# Patient Record
Sex: Female | Born: 1990
Health system: Southern US, Community
[De-identification: ages and names within clinical notes are randomized; demographics above are authoritative.]

## PROBLEM LIST (undated history)

## (undated) DIAGNOSIS — D649 Anemia, unspecified: Secondary | ICD-10-CM

---

## 2015-09-12 DIAGNOSIS — G43009 Migraine without aura, not intractable, without status migrainosus: Secondary | ICD-10-CM | POA: Insufficient documentation

## 2016-01-17 DIAGNOSIS — J301 Allergic rhinitis due to pollen: Secondary | ICD-10-CM | POA: Insufficient documentation

## 2016-09-16 DIAGNOSIS — F419 Anxiety disorder, unspecified: Secondary | ICD-10-CM | POA: Insufficient documentation

## 2016-09-16 DIAGNOSIS — F32A Depression, unspecified: Secondary | ICD-10-CM | POA: Insufficient documentation

## 2017-03-06 DIAGNOSIS — G4700 Insomnia, unspecified: Secondary | ICD-10-CM | POA: Insufficient documentation

## 2017-10-01 MED FILL — AZITHROMYCIN 250 MG TABS: 250 | 5 days supply | Qty: 6 | Fill #0

## 2017-10-01 MED FILL — predniSONE 20 MG TABS: 20 | 5 days supply | Qty: 10 | Fill #0

## 2018-05-26 MED FILL — EPINEPHRINE 0.3 MG AUTO-INJ: 0.3 | 30 days supply | Qty: 2 | Fill #0

## 2018-11-18 ENCOUNTER — Other Ambulatory Visit: Payer: Self-pay

## 2018-11-18 ENCOUNTER — Telehealth: Payer: Self-pay | Admitting: Internal Medicine

## 2018-11-18 DIAGNOSIS — Z20822 Contact with and (suspected) exposure to covid-19: Secondary | ICD-10-CM

## 2018-11-18 NOTE — Telephone Encounter (Addendum)
Employee called stating that she had spoken with Victorino Dike on 11/17/18 and was informed that if she could go for COVID-19 testing on 11/18/18 to call back later to inform staff.   Gave patient instructions as to the location of testing site its's at  [facility] [place]hang and hours are from 9am to 11am.   Order will be placed for testing

## 2019-03-03 MED FILL — LARIN 1.5 MG-30 MCG TABLET: 1.5-30 | 28 days supply | Qty: 28 | Fill #0

## 2019-06-14 DIAGNOSIS — J301 Allergic rhinitis due to pollen: Secondary | ICD-10-CM | POA: Diagnosis not present

## 2019-06-14 DIAGNOSIS — Z3041 Encounter for surveillance of contraceptive pills: Secondary | ICD-10-CM | POA: Diagnosis not present

## 2019-06-14 DIAGNOSIS — Z0000 Encounter for general adult medical examination without abnormal findings: Secondary | ICD-10-CM | POA: Diagnosis not present

## 2019-06-14 DIAGNOSIS — G4700 Insomnia, unspecified: Secondary | ICD-10-CM | POA: Diagnosis not present

## 2019-06-14 DIAGNOSIS — F3342 Major depressive disorder, recurrent, in full remission: Secondary | ICD-10-CM | POA: Diagnosis not present

## 2019-06-14 DIAGNOSIS — J3081 Allergic rhinitis due to animal (cat) (dog) hair and dander: Secondary | ICD-10-CM | POA: Diagnosis not present

## 2019-06-14 DIAGNOSIS — Z23 Encounter for immunization: Secondary | ICD-10-CM | POA: Diagnosis not present

## 2019-06-23 DIAGNOSIS — Z131 Encounter for screening for diabetes mellitus: Secondary | ICD-10-CM | POA: Diagnosis not present

## 2019-06-23 DIAGNOSIS — L678 Other hair color and hair shaft abnormalities: Secondary | ICD-10-CM | POA: Diagnosis not present

## 2019-06-23 DIAGNOSIS — Z1159 Encounter for screening for other viral diseases: Secondary | ICD-10-CM | POA: Diagnosis not present

## 2019-06-23 DIAGNOSIS — L709 Acne, unspecified: Secondary | ICD-10-CM | POA: Diagnosis not present

## 2019-06-23 DIAGNOSIS — Z13220 Encounter for screening for lipoid disorders: Secondary | ICD-10-CM | POA: Diagnosis not present

## 2019-09-29 DIAGNOSIS — M542 Cervicalgia: Secondary | ICD-10-CM | POA: Diagnosis not present

## 2019-12-13 DIAGNOSIS — Z124 Encounter for screening for malignant neoplasm of cervix: Secondary | ICD-10-CM | POA: Diagnosis not present

## 2019-12-14 LAB — HM PAP SMEAR: HM Pap smear: NEGATIVE

## 2019-12-27 DIAGNOSIS — Z20822 Contact with and (suspected) exposure to covid-19: Secondary | ICD-10-CM | POA: Diagnosis not present

## 2020-02-29 HISTORY — PX: WISDOM TOOTH EXTRACTION: SHX21

## 2020-06-26 DIAGNOSIS — J301 Allergic rhinitis due to pollen: Secondary | ICD-10-CM | POA: Diagnosis not present

## 2020-06-26 DIAGNOSIS — J3089 Other allergic rhinitis: Secondary | ICD-10-CM | POA: Diagnosis not present

## 2020-06-26 DIAGNOSIS — J3081 Allergic rhinitis due to animal (cat) (dog) hair and dander: Secondary | ICD-10-CM | POA: Diagnosis not present

## 2020-06-26 DIAGNOSIS — H1013 Acute atopic conjunctivitis, bilateral: Secondary | ICD-10-CM | POA: Diagnosis not present

## 2020-07-05 DIAGNOSIS — Z13220 Encounter for screening for lipoid disorders: Secondary | ICD-10-CM | POA: Diagnosis not present

## 2020-07-05 DIAGNOSIS — Z113 Encounter for screening for infections with a predominantly sexual mode of transmission: Secondary | ICD-10-CM | POA: Diagnosis not present

## 2020-07-05 DIAGNOSIS — Z131 Encounter for screening for diabetes mellitus: Secondary | ICD-10-CM | POA: Diagnosis not present

## 2020-07-06 DIAGNOSIS — Z3201 Encounter for pregnancy test, result positive: Secondary | ICD-10-CM | POA: Diagnosis not present

## 2020-08-01 ENCOUNTER — Ambulatory Visit (INDEPENDENT_AMBULATORY_CARE_PROVIDER_SITE_OTHER): Payer: BC Managed Care – PPO

## 2020-08-01 ENCOUNTER — Other Ambulatory Visit: Payer: Self-pay

## 2020-08-01 VITALS — Ht 63.0 in

## 2020-08-01 DIAGNOSIS — Z789 Other specified health status: Secondary | ICD-10-CM

## 2020-08-01 DIAGNOSIS — Z3400 Encounter for supervision of normal first pregnancy, unspecified trimester: Secondary | ICD-10-CM | POA: Insufficient documentation

## 2020-08-01 DIAGNOSIS — O3680X0 Pregnancy with inconclusive fetal viability, not applicable or unspecified: Secondary | ICD-10-CM

## 2020-08-01 DIAGNOSIS — Z3401 Encounter for supervision of normal first pregnancy, first trimester: Secondary | ICD-10-CM

## 2020-08-01 NOTE — Progress Notes (Signed)
Patient ID: Nicole Clay, female   DOB: 05-28-1991, 30 y.o.   MRN: [id number] Patient was assessed and managed by nursing staff during this encounter. I have reviewed the chart and agree with the documentation and plan. I have also made any necessary editorial changes.  Scheryl Darter, MD 08/01/2020 11:34 AM

## 2020-08-01 NOTE — Progress Notes (Signed)
Patient ID: Nicole Clay, female   DOB: 11/11/1990, 29 y.o.   MRN: [id number] Patient was assessed and managed by nursing staff during this encounter. I have reviewed the chart and agree with the documentation and plan. I have also made any necessary editorial changes.  Zoraya Fiorenza, MD 08/01/2020 11:34 AM    

## 2020-08-01 NOTE — Progress Notes (Signed)
PRENATAL INTAKE SUMMARY  Nicole Clay presents today New OB Nurse Interview.  OB History    Gravida  1   Para      Term      Preterm      AB      Living        SAB      IAB      Ectopic      Multiple      Live Births             I have reviewed the patient's medical, obstetrical, social, and family histories, medications, and available lab results.  SUBJECTIVE She has no unusual complaints  OBJECTIVE Initial Physical Exam (New OB)  GENERAL APPEARANCE: alert, well appearing   ASSESSMENT Normal pregnancy  PLAN Prenatal care to be completed at [facility] All New OB labs to be completed at [facility] provider visit Baby Scripts Ordered Blood Pressure cuff given U/S performed today reveals single live IUP at [redacted]w[redacted]d by [facility]. FHR 161 PHQ2 score:0 GAD 7 score: 0

## 2020-08-03 ENCOUNTER — Telehealth: Payer: Self-pay | Admitting: *Deleted

## 2020-08-03 NOTE — Telephone Encounter (Signed)
Pt had pfizer vaccine 1st dose on 07-21-2019 at [facility], 2nd dose on 08-08-2019 and pt is sch for pfizer booster on 08-09-2020 at [facility]

## 2020-08-09 ENCOUNTER — Ambulatory Visit: Payer: BC Managed Care – PPO

## 2020-08-17 ENCOUNTER — Other Ambulatory Visit (HOSPITAL_COMMUNITY)
Admission: RE | Admit: 2020-08-17 | Discharge: 2020-08-17 | Disposition: A | Discharge status: Home / Self Care | Payer: BC Managed Care – PPO | Source: Ambulatory Visit | Attending: Obstetrics and Gynecology | Admitting: Obstetrics and Gynecology

## 2020-08-17 ENCOUNTER — Encounter: Payer: Self-pay | Admitting: Obstetrics and Gynecology

## 2020-08-17 ENCOUNTER — Other Ambulatory Visit: Payer: Self-pay

## 2020-08-17 ENCOUNTER — Ambulatory Visit (INDEPENDENT_AMBULATORY_CARE_PROVIDER_SITE_OTHER): Payer: BC Managed Care – PPO | Admitting: Obstetrics and Gynecology

## 2020-08-17 VITALS — BP 121/72 | HR 86 | Wt 146.0 lb

## 2020-08-17 DIAGNOSIS — Z3401 Encounter for supervision of normal first pregnancy, first trimester: Secondary | ICD-10-CM | POA: Diagnosis not present

## 2020-08-17 DIAGNOSIS — Z3402 Encounter for supervision of normal first pregnancy, second trimester: Secondary | ICD-10-CM | POA: Diagnosis not present

## 2020-08-17 DIAGNOSIS — Z3A13 13 weeks gestation of pregnancy: Secondary | ICD-10-CM

## 2020-08-17 DIAGNOSIS — Z3400 Encounter for supervision of normal first pregnancy, unspecified trimester: Secondary | ICD-10-CM

## 2020-08-17 NOTE — Progress Notes (Signed)
New OB, reports no problems today  

## 2020-08-17 NOTE — Patient Instructions (Signed)
First Trimester of Pregnancy  The first trimester of pregnancy starts on the first day of your last menstrual period until the end of week 12. This is months 1 through 3 of pregnancy. A week after a sperm fertilizes an egg, the egg will implant into the wall of the uterus and begin to develop into a baby. By the end of 12 weeks, all the baby's organs will be formed and the baby will be 2-3 inches in size. Body changes during your first trimester Your body goes through many changes during pregnancy. The changes vary and generally return to normal after your baby is born. Physical changes  You may gain or lose weight.  Your breasts may begin to grow larger and become tender. The tissue that surrounds your nipples (areola) may become darker.  Dark spots or blotches (chloasma or mask of pregnancy) may develop on your face.  You may have changes in your hair. These can include thickening or thinning of your hair or changes in texture. Health changes  You may feel nauseous, and you may vomit.  You may have heartburn.  You may develop headaches.  You may develop constipation.  Your gums may bleed and may be sensitive to brushing and flossing. Other changes  You may tire easily.  You may urinate more often.  Your menstrual periods will stop.  You may have a loss of appetite.  You may develop cravings for certain kinds of food.  You may have changes in your emotions from day to day.  You may have more vivid and strange dreams. Follow these instructions at home: Medicines  Follow your health care provider's instructions regarding medicine use. Specific medicines may be either safe or unsafe to take during pregnancy. Do not take any medicines unless told to by your health care provider.  Take a prenatal vitamin that contains at least 600 micrograms (mcg) of folic acid. Eating and drinking  Eat a healthy diet that includes fresh fruits and vegetables, whole grains, good sources of  protein such as meat, eggs, or tofu, and low-fat dairy products.  Avoid raw meat and unpasteurized juice, milk, and cheese. These carry germs that can harm you and your baby.  If you feel nauseous or you vomit: ? Eat 4 or 5 small meals a day instead of 3 large meals. ? Try eating a few soda crackers. ? Drink liquids between meals instead of during meals.  You may need to take these actions to prevent or treat constipation: ? Drink enough fluid to keep your urine pale yellow. ? Eat foods that are high in fiber, such as beans, whole grains, and fresh fruits and vegetables. ? Limit foods that are high in fat and processed sugars, such as fried or sweet foods. Activity  Exercise only as directed by your health care provider. Most people can continue their usual exercise routine during pregnancy. Try to exercise for 30 minutes at least 5 days a week.  Stop exercising if you develop pain or cramping in the lower abdomen or lower back.  Avoid exercising if it is very hot or humid or if you are at high altitude.  Avoid heavy lifting.  If you choose to, you may have sex unless your health care provider tells you not to. Relieving pain and discomfort  Wear a good support bra to relieve breast tenderness.  Rest with your legs elevated if you have leg cramps or low back pain.  If you develop bulging veins (varicose veins) in   your legs: ? Wear support hose as told by your health care provider. ? Elevate your feet for 15 minutes, 3-4 times a day. ? Limit salt in your diet. Safety  Wear your seat belt at all times when driving or riding in a car.  Talk with your health care provider if someone is verbally or physically abusive to you.  Talk with your health care provider if you are feeling sad or have thoughts of hurting yourself. Lifestyle  Do not use hot tubs, steam rooms, or saunas.  Do not douche. Do not use tampons or scented sanitary pads.  Do not use herbal remedies, alcohol,  illegal drugs, or medicines that are not approved by your health care provider. Chemicals in these products can harm your baby.  Do not use any products that contain nicotine or tobacco, such as cigarettes, e-cigarettes, and chewing tobacco. If you need help quitting, ask your health care provider.  Avoid cat litter boxes and soil used by cats. These carry germs that can cause birth defects in the baby and possibly loss of the unborn baby (fetus) by miscarriage or stillbirth. General instructions  During routine prenatal visits in the first trimester, your health care provider will do a physical exam, perform necessary tests, and ask you how things are going. Keep all follow-up visits. This is important.  Ask for help if you have counseling or nutritional needs during pregnancy. Your health care provider can offer advice or refer you to specialists for help with various needs.  Schedule a dentist appointment. At home, brush your teeth with a soft toothbrush. Floss gently.  Write down your questions. Take them to your prenatal visits. Where to find more information  American Pregnancy Association: americanpregnancy.org  [employer]: https://www.todd-brady.net/  Office on [employer] Health: [employer].nz Contact a health care provider if you have:  Dizziness.  A fever.  Mild pelvic cramps, pelvic pressure, or nagging pain in the abdominal area.  Nausea, vomiting, or diarrhea that lasts for 24 hours or longer.  A bad-smelling vaginal discharge.  Pain when you urinate.  Known exposure to a contagious illness, such as chickenpox, measles, Zika virus, HIV, or hepatitis. Get help right away if you have:  Spotting or bleeding from your vagina.  Severe abdominal cramping or pain.  Shortness of breath or chest pain.  Any kind of trauma, such as from a fall or a car crash.  New or increased pain, swelling, or redness in an  arm or leg. Summary  The first trimester of pregnancy starts on the first day of your last menstrual period until the end of week 12 (months 1 through 3).  Eating 4 or 5 small meals a day rather than 3 large meals may help to relieve nausea and vomiting.  Do not use any products that contain nicotine or tobacco, such as cigarettes, e-cigarettes, and chewing tobacco. If you need help quitting, ask your health care provider.  Keep all follow-up visits. This is important. This information is not intended to replace advice given to you by your health care provider. Make sure you discuss any questions you have with your health care provider. Document Revised: 11/23/2019 Document Reviewed: 09/29/2019 Elsevier Patient Education  2021 Elsevier Inc.  Second Trimester of Pregnancy  The second trimester of pregnancy is from week 13 through week 27. This is months 4 through 6 of pregnancy. The second trimester is often a time when you feel your best. Your body has adjusted to being  pregnant, and you begin to feel better physically. During the second trimester:  Morning sickness has lessened or stopped completely.  You may have more energy.  You may have an increase in appetite. The second trimester is also a time when the unborn baby (fetus) is growing rapidly. At the end of the sixth month, the fetus may be up to 12 inches long and weigh about 1 pounds. You will likely begin to feel the baby move (quickening) between 16 and 20 weeks of pregnancy. Body changes during your second trimester Your body continues to go through many changes during your second trimester. The changes vary and generally return to normal after the baby is born. Physical changes  Your weight will continue to increase. You will notice your lower abdomen bulging out.  You may begin to get stretch marks on your hips, abdomen, and breasts.  Your breasts will continue to grow and to become tender.  Dark spots or blotches  (chloasma or mask of pregnancy) may develop on your face.  A dark line from your belly button to the pubic area (linea nigra) may appear.  You may have changes in your hair. These can include thickening of your hair, rapid growth, and changes in texture. Some people also have hair loss during or after pregnancy, or hair that feels dry or thin. Health changes  You may develop headaches.  You may have heartburn.  You may develop constipation.  You may develop hemorrhoids or swollen, bulging veins (varicose veins).  Your gums may bleed and may be sensitive to brushing and flossing.  You may urinate more often because the fetus is pressing on your bladder.  You may have back pain. This is caused by: ? Weight gain. ? Pregnancy hormones that are relaxing the joints in your pelvis. ? A shift in weight and the muscles that support your balance. Follow these instructions at home: Medicines  Follow your health care provider's instructions regarding medicine use. Specific medicines may be either safe or unsafe to take during pregnancy. Do not take any medicines unless approved by your health care provider.  Take a prenatal vitamin that contains at least 600 micrograms (mcg) of folic acid. Eating and drinking  Eat a healthy diet that includes fresh fruits and vegetables, whole grains, good sources of protein such as meat, eggs, or tofu, and low-fat dairy products.  Avoid raw meat and unpasteurized juice, milk, and cheese. These carry germs that can harm you and your baby.  You may need to take these actions to prevent or treat constipation: ? Drink enough fluid to keep your urine pale yellow. ? Eat foods that are high in fiber, such as beans, whole grains, and fresh fruits and vegetables. ? Limit foods that are high in fat and processed sugars, such as fried or sweet foods. Activity  Exercise only as directed by your health care provider. Most people can continue their usual exercise  routine during pregnancy. Try to exercise for 30 minutes at least 5 days a week. Stop exercising if you develop contractions in your uterus.  Stop exercising if you develop pain or cramping in the lower abdomen or lower back.  Avoid exercising if it is very hot or humid or if you are at a high altitude.  Avoid heavy lifting.  If you choose to, you may have sex unless your health care provider tells you not to. Relieving pain and discomfort  Wear a supportive bra to prevent discomfort from breast tenderness.  Take  warm sitz baths to soothe any pain or discomfort caused by hemorrhoids. Use hemorrhoid cream if your health care provider approves.  Rest with your legs raised (elevated) if you have leg cramps or low back pain.  If you develop varicose veins: ? Wear support hose as told by your health care provider. ? Elevate your feet for 15 minutes, 3-4 times a day. ? Limit salt in your diet. Safety  Wear your seat belt at all times when driving or riding in a car.  Talk with your health care provider if someone is verbally or physically abusive to you. Lifestyle  Do not use hot tubs, steam rooms, or saunas.  Do not douche. Do not use tampons or scented sanitary pads.  Avoid cat litter boxes and soil used by cats. These carry germs that can cause birth defects in the baby and possibly loss of the fetus by miscarriage or stillbirth.  Do not use herbal remedies, alcohol, illegal drugs, or medicines that are not approved by your health care provider. Chemicals in these products can harm your baby.  Do not use any products that contain nicotine or tobacco, such as cigarettes, e-cigarettes, and chewing tobacco. If you need help quitting, ask your health care provider. General instructions  During a routine prenatal visit, your health care provider will do a physical exam and other tests. He or she will also discuss your overall health. Keep all follow-up visits. This is important.  Ask  your health care provider for a referral to a local prenatal education class.  Ask for help if you have counseling or nutritional needs during pregnancy. Your health care provider can offer advice or refer you to specialists for help with various needs. Where to find more information  American Pregnancy Association: americanpregnancy.org  [employer]: https://www.todd-brady.net/  Office on [employer] Health: [employer].nz Contact a health care provider if you have:  A headache that does not go away when you take medicine.  Vision changes or you see spots in front of your eyes.  Mild pelvic cramps, pelvic pressure, or nagging pain in the abdominal area.  Persistent nausea, vomiting, or diarrhea.  A bad-smelling vaginal discharge or foul-smelling urine.  Pain when you urinate.  Sudden or extreme swelling of your face, hands, ankles, feet, or legs.  A fever. Get help right away if you:  Have fluid leaking from your vagina.  Have spotting or bleeding from your vagina.  Have severe abdominal cramping or pain.  Have difficulty breathing.  Have chest pain.  Have fainting spells.  Have not felt your baby move for the time period told by your health care provider.  Have new or increased pain, swelling, or redness in an arm or leg. Summary  The second trimester of pregnancy is from week 13 through week 27 (months 4 through 6).  Do not use herbal remedies, alcohol, illegal drugs, or medicines that are not approved by your health care provider. Chemicals in these products can harm your baby.  Exercise only as directed by your health care provider. Most people can continue their usual exercise routine during pregnancy.  Keep all follow-up visits. This is important. This information is not intended to replace advice given to you by your health care provider. Make sure you discuss any questions you have with your health  care provider. Document Revised: 11/23/2019 Document Reviewed: 09/29/2019 Elsevier Patient Education  2021 Elsevier Inc.  Genetic Testing During Pregnancy Why is genetic testing done? Genetic testing during  pregnancy is also called prenatal genetic testing. This type of testing can determine if your baby is at risk of being born with a disorder caused by abnormal genes or chromosomes (genetic disorder). Chromosomes contain genes that control how your baby will develop in your womb. There are many different genetic disorders. Examples of genetic disorders that may be found through genetic testing include Down syndrome and cystic fibrosis. Gene changes (mutations) can be passed down through families. Genetic testing is offered to women during pregnancy. You can choose whether to have genetic testing. Having genetic testing allows you to:  Discuss your test results and options with your health care provider.  Prepare for a baby that may be born with a genetic disorder. Learning about the disorder ahead of time helps you be better prepared to manage it. Your health care providers can also be prepared in case your baby requires special care before or after birth.  Consider whether you want to continue with the pregnancy. In some cases, genetic testing may be done to learn about the traits a child will inherit. Types of genetic tests There are two basic types of genetic testing. Screening tests indicate whether your developing baby (fetus) is at higher risk for a genetic disorder. Diagnostic tests check actual fetal cells to diagnose a genetic disorder. Screening tests Screening tests will not harm your baby. They are recommended for all pregnant women. Types of screening tests include:  Carrier screening. This test involves checking genes from both parents by testing their blood or saliva. The test checks to find out if the parents carry a genetic mutation that may be passed to a baby. In most cases,  both parents must carry the mutation for a baby to be at risk.  First trimester screening. This test combines a blood test with sound wave imaging of your baby (fetal ultrasound). This screening test checks for a risk of Down syndrome or other defects caused by having extra chromosomes. The ultrasound also checks for defects of the heart, abdomen, or skeleton.  Second trimester screening also combines a blood test with a fetal ultrasound exam. This test checks for a risk of Down syndrome or other defects caused by having extra chromosomes. The ultrasound allows your health care provider to look for genetic defects of the face, brain, spine, heart, or limbs. Some women may choose to only have an ultrasound exam without a blood test.  Combined or sequential screening. This type of testing combines the results of first and second trimester screening. This type of testing may be more accurate than first or second trimester screening alone.  Cell-free DNA testing. This is a blood test that detects cells released by the placenta that get into the mother's blood. It can be used to check for a risk of Down syndrome, other extra chromosome syndromes, and disorders caused by abnormal numbers of sex chromosomes. This test can be done any time after 10 weeks of pregnancy.      Diagnostic tests Diagnostic tests carry slight risks of problems, including bleeding, infection, and loss of the pregnancy. These tests are done only if your baby is at risk for a genetic disorder. Your health care provider will discuss the risks and benefits of having diagnostic tests before performing these types of tests. Examples of diagnostic tests include:  Chorionic villus sampling (CVS). This involves a procedure to remove and test a sample of cells taken from the placenta. The procedure may be done between 10 and 12 weeks of  pregnancy.  Amniocentesis. This involves a procedure to remove and test a sample of fluid (amniotic fluid)  and cells from the sac that surrounds the developing baby. The procedure may be done any time during the pregnancy, but it is usually done between 15 and 20 weeks of pregnancy. What do the results mean? For a screening test:  If the results are negative, it often means that your child is not at higher risk. There is still a slight chance your child could have a genetic disorder.  If the results are positive, it does not mean your child will have a genetic disorder. It may mean that your child has a higher-than-normal risk for a genetic disorder. In that case, you should talk with your health care provider about whether you should have diagnostic genetic tests. For a diagnostic test:  If the result is negative, it is unlikely that your child will have a genetic disorder.  If the test is positive for a genetic disorder, it is likely that your child will have the disorder. The test may not tell how severe the disorder will be. Talk with your health care provider about your options. Talk with your health care provider about what your results mean. Questions to ask your health care provider Before talking to your health care provider about genetic testing, find out if there is a history of genetic disorders in your family. It may also help to know your family's ethnic origins. Then ask your health care provider the following questions:  Is my baby at risk for a genetic disorder?  What are the benefits of having genetic screening?  What tests are best for me and my baby?  What are the risks of each test?  If I get a positive result on a screening test, what is the next step?  Should I meet with a genetic counselor?  Should my partner or other members of my family be tested?  How much do the tests cost? Will my insurance cover the testing? Summary  Genetic testing is done during pregnancy to find out whether your child is at risk for a genetic disorder.  Genetic testing is offered to  women during pregnancy. You can choose whether to have genetic testing.  There are two basic types of genetic testing. Screening tests indicate whether your developing baby (fetus) is at higher risk for a genetic disorder. Diagnostic tests check actual fetal cells to diagnose a genetic disorder.  If a diagnostic genetic test is positive, talk with your health care provider about your options. This information is not intended to replace advice given to you by your health care provider. Make sure you discuss any questions you have with your health care provider. Document Revised: 01/06/2020 Document Reviewed: 01/06/2020 Elsevier Patient Education  2021 [employer].

## 2020-08-17 NOTE — Progress Notes (Signed)
INITIAL OBSTETRICAL VISIT Patient name: Nicole Clay MRN [id number]  Date of birth: 1990/12/22 Chief Complaint:   Initial Prenatal Visit  History of Present Illness:   Nicole Clay is a 30 y.o. G1P0 Pakistani female at [redacted]w[redacted]d by 11.2 weeks U/S with an Estimated Date of Delivery: 02/18/21 being seen today for her initial obstetrical visit.  Her obstetrical history is significant for none. This is an unplanned, but desired pregnancy. She and the father of the baby (FOB)/spouse "Gwenyth Bouillon" live together. She has a support system that consists of her husband/family/friends. Today she reports occasional LT side cramping that resolves with massage or Tylenol.   Patient's last menstrual period was 05/21/2020. Last pap 05/2020. Results were: normal Review of Systems:   Pertinent items are noted in HPI Denies cramping/contractions, leakage of fluid, vaginal bleeding, abnormal vaginal discharge w/ itching/odor/irritation, headaches, visual changes, shortness of breath, chest pain, abdominal pain, severe nausea/vomiting, or problems with urination or bowel movements unless otherwise stated above.  Pertinent History Reviewed:  Reviewed past medical,surgical, social, obstetrical and family history.  Reviewed problem list, medications and allergies. OB History  Gravida Para Term Preterm AB Living  1            SAB IAB Ectopic Multiple Live Births               # Outcome Date GA Lbr Len/2nd Weight Sex Delivery Anes PTL Lv  1 Current            Physical Assessment:   Vitals:   08/17/20 1020  BP: 121/72  Pulse: 86  Weight: 146 lb (66.2 kg)  Body mass index is 25.86 kg/m.       Physical Examination:  General appearance - well appearing, and in no distress  Mental status - alert, oriented to person, place, and time  Psych:  She has a normal mood and affect  Skin - warm and dry, normal color, no suspicious lesions noted  Chest - effort normal, all lung fields clear to auscultation  bilaterally  Heart - normal rate and regular rhythm  Abdomen - soft, nontender  Extremities:  No swelling or varicosities noted  Pelvic - VULVA: normal appearing vulva with no masses, tenderness or lesions  VAGINA: normal appearing vagina with normal color and discharge, no lesions.    CERVIX: normal appearing cervix with mucoid discharge, no lesions, no CMT  Thin prep pap is not done    FHTs by doppler: 164 bpm  Assessment & Plan:  1) Low-Risk Pregnancy G1P0 at [redacted]w[redacted]d with an Estimated Date of Delivery: 02/18/21   2) Initial OB visit - Welcomed to practice and introduced self to patient in addition to discussing other advanced practice providers that she may be seeing at this practice - Congratulated patient - Anticipatory guidance on upcoming appointments - Educated on COVID19 and pregnancy and the integration of virtual appointments  - Educated on babyscripts app- patient reports she has not received email, encouraged to look in spam folder and to call office if she still has not received email - patient verbalizes understanding    3) Encounter for supervision of normal first pregnancy in first trimester - Cytology - PAP( [place]) - Cervicovaginal ancillary only( [place]) - Culture, OB Urine - CBC/D/Plt+RPR+Rh+ABO+Rub Ab... - Genetic Screening - US MFM OB COMP + 14 WK; Future  4) [redacted] weeks gestation of pregnancy    Meds: No orders of the defined types were placed in this encounter.   Initial labs obtained Continue  prenatal vitamins Reviewed n/v relief measures and warning s/s to report Reviewed recommended weight gain based on pre-gravid BMI Encouraged well-balanced diet Genetic Screening discussed: ordered Cystic fibrosis, SMA, Fragile X screening discussed ordered The nature of [facility] with multiple MDs and other Advanced Practice Providers was explained to patient; also emphasized that residents, students are part of our team.   Discussed optimized OB schedule and video visits. Advised can have an in-office visit whenever she feels she needs to be seen.  Does own BP cuff. Check BP weekly, let us know if >140/90. Advised to call during normal business hours and there is an after-hours nurse line available.    Follow-up: Return in about 6 weeks (around 09/28/2020) for Return OB - My Chart video.   Orders Placed This Encounter  Procedures  . Culture, OB Urine  . [place] MFM OB COMP + 14 WK  . CBC/D/Plt+RPR+Rh+ABO+Rub Ab...  . Genetic Screening    Raelyn Mora MSN, [place] 08/17/2020

## 2020-08-18 LAB — CBC/D/PLT+RPR+RH+ABO+RUB AB...
Antibody Screen: NEGATIVE
Basophils Absolute: 0 10*3/uL (ref 0.0–0.2)
Basos: 0 %
EOS (ABSOLUTE): 0.2 10*3/uL (ref 0.0–0.4)
Eos: 2 %
HCV Ab: <0.1 s/co ratio (ref 0.0–0.9)
HIV Screen 4th Generation wRfx: NONREACTIVE
Hematocrit: 34.6 % (ref 34.0–46.6)
Hemoglobin: 11.9 g/dL (ref 11.1–15.9)
Hepatitis B Surface Ag: NEGATIVE
Immature Grans (Abs): 0.1 10*3/uL (ref 0.0–0.1)
Immature Granulocytes: 1 %
Lymphocytes Absolute: 2.6 10*3/uL (ref 0.7–3.1)
Lymphs: 18 %
MCH: 29.8 pg (ref 26.6–33.0)
MCHC: 34.4 g/dL (ref 31.5–35.7)
MCV: 87 fL (ref 79–97)
Monocytes Absolute: 0.8 10*3/uL (ref 0.1–0.9)
Monocytes: 5 %
Neutrophils Absolute: 10.6 10*3/uL — ABNORMAL HIGH (ref 1.4–7.0)
Neutrophils: 74 %
Platelets: 354 10*3/uL (ref 150–450)
RBC: 3.99 x10E6/uL (ref 3.77–5.28)
RDW: 12.7 % (ref 11.7–15.4)
RPR Ser Ql: NONREACTIVE
Rh Factor: POSITIVE
Rubella Antibodies, IGG: 2.1 index (ref >0.99)
WBC: 14.3 10*3/uL — ABNORMAL HIGH (ref 3.4–10.8)

## 2020-08-18 LAB — HCV INTERPRETATION

## 2020-08-20 LAB — CERVICOVAGINAL ANCILLARY ONLY
Chlamydia: NEGATIVE
Comment: NEGATIVE
Comment: NORMAL
Neisseria Gonorrhea: NEGATIVE

## 2020-08-20 LAB — URINE CULTURE, OB REFLEX

## 2020-08-20 LAB — CULTURE, OB URINE

## 2020-08-27 ENCOUNTER — Encounter: Payer: Self-pay | Admitting: Obstetrics and Gynecology

## 2020-09-03 ENCOUNTER — Encounter: Payer: Self-pay | Admitting: Obstetrics and Gynecology

## 2020-09-25 ENCOUNTER — Other Ambulatory Visit: Payer: Self-pay | Admitting: *Deleted

## 2020-09-25 ENCOUNTER — Other Ambulatory Visit: Payer: Self-pay

## 2020-09-25 ENCOUNTER — Ambulatory Visit: Payer: BC Managed Care – PPO | Attending: Obstetrics and Gynecology

## 2020-09-25 DIAGNOSIS — Z3401 Encounter for supervision of normal first pregnancy, first trimester: Secondary | ICD-10-CM | POA: Diagnosis not present

## 2020-09-25 DIAGNOSIS — Z362 Encounter for other antenatal screening follow-up: Secondary | ICD-10-CM

## 2020-09-28 ENCOUNTER — Encounter: Payer: Self-pay | Admitting: Nurse Practitioner

## 2020-09-28 ENCOUNTER — Telehealth (INDEPENDENT_AMBULATORY_CARE_PROVIDER_SITE_OTHER): Payer: BC Managed Care – PPO | Admitting: Nurse Practitioner

## 2020-09-28 VITALS — BP 106/61 | HR 78

## 2020-09-28 DIAGNOSIS — J301 Allergic rhinitis due to pollen: Secondary | ICD-10-CM

## 2020-09-28 DIAGNOSIS — F419 Anxiety disorder, unspecified: Secondary | ICD-10-CM

## 2020-09-28 DIAGNOSIS — Z3A19 19 weeks gestation of pregnancy: Secondary | ICD-10-CM

## 2020-09-28 DIAGNOSIS — Z3400 Encounter for supervision of normal first pregnancy, unspecified trimester: Secondary | ICD-10-CM

## 2020-09-28 DIAGNOSIS — G43009 Migraine without aura, not intractable, without status migrainosus: Secondary | ICD-10-CM

## 2020-09-28 NOTE — Progress Notes (Signed)
OBSTETRICS PRENATAL VIRTUAL VISIT ENCOUNTER NOTE  Provider location: [facility]   Patient location: Work  I connected with Nicole Clay on 09/28/20 at  8:15 AM EDT by MyChart Video Encounter and verified that I am speaking with the correct person using two identifiers.   I discussed the limitations, risks, security and privacy concerns of performing an evaluation and management service virtually and the availability of in person appointments. I also discussed with the patient that there may be a patient responsible charge related to this service. The patient expressed understanding and agreed to proceed. Subjective:  Nicole Clay is a 30 y.o. G1P0 at [redacted]w[redacted]d being seen today for ongoing prenatal care.  She is currently monitored for the following issues for this low-risk pregnancy and has Supervision of normal first pregnancy, antepartum on their problem list.  Patient reports questions about participating in [place].  Contractions: Not present. Vag. Bleeding: None.  Movement: Absent. Denies any leaking of fluid.   The following portions of the patient's history were reviewed and updated as appropriate: allergies, current medications, past family history, past medical history, past social history, past surgical history and problem list.   Objective:   Vitals:   09/28/20 0822  BP: 106/61  Pulse: 78    Fetal Status:     Movement: Absent     General:  Alert, oriented and cooperative. Patient is in no acute distress.  Respiratory: Normal respiratory effort, no problems with respiration noted  Mental Status: Normal mood and affect. Normal behavior. Normal judgment and thought content.  Rest of physical exam deferred due to type of encounter  Imaging: [place] MFM OB COMP + 14 WK  Result Date: 09/25/2020 ----------------------------------------------------------------------  OBSTETRICS REPORT                       (Signed Final 09/25/2020 12:28 pm)  ---------------------------------------------------------------------- Patient Info  ID #:       [id number]                          D.O.B.:  06/16/1991 (30 yrs)  Name:       Nicole Clay                     Visit Date: 09/25/2020 10:13 am ---------------------------------------------------------------------- Performed By  Attending:        Ma Rings MD         Ref. Address:     [address]                                                             [place], [place]  [address]  Performed By:     Reinaldo Raddle            [facility]  Referred By:      Adam Phenix                    MD ---------------------------------------------------------------------- Orders  #  Description                           Code        Ordered By  1  [place] MFM OB COMP + 14 WK                76805.01    ROLITTA DAWSON ----------------------------------------------------------------------  #  Order #                     Accession #                Episode #  1  [id number]                   [id number]                 [id number] ---------------------------------------------------------------------- Indications  Fetal abnormality - other known or suspected   O35.9XX0  [redacted] weeks gestation of pregnancy                Z3A.19  Encounter for antenatal screening for          Z36.3  malformations (LR NIPS, Neg [place]) ---------------------------------------------------------------------- Fetal Evaluation  Num Of Fetuses:         1  Fetal Heart Rate(bpm):  153  Cardiac Activity:       Observed  Presentation:           Breech  Placenta:               Anterior  P. Cord Insertion:      Visualized   Amniotic Fluid  AFI FV:      Within normal limits                              Largest Pocket(cm)  4.01  Comment:    PCI 2.09cm from placental edge ---------------------------------------------------------------------- Biometry  BPD:      42.5  mm     G. Age:  18w 6d         38  %    CI:        78.16   %    70 - 86                                                          FL/HC:      18.0   %    16.1 - 18.3  HC:      152.1  mm     G. Age:  18w 2d          8  %    HC/AC:      1.11        1.09 - 1.39  AC:      137.4  mm     G. Age:  19w 1d         46  %    FL/BPD:     64.5   %  FL:       27.4  mm     G. Age:  18w 3d         18  %    FL/AC:      19.9   %    20 - 24  Clay:      28.3  mm     G. Age:  19w 1d         50  %  CER:      19.1  mm     G. Age:  18w 5d         21  %  NFT:       3.3  mm  LV:        6.9  mm  CM:        4.6  mm  Est. FW:     255  gm      0 lb 9 oz     24  % ---------------------------------------------------------------------- OB History  Gravidity:    1  Living:       0 ---------------------------------------------------------------------- Gestational Age  LMP:           18w 1d        Date:  05/21/20                 EDD:   02/25/21  U/S Today:     18w 5d                                        EDD:   02/21/21  Best:          19w 1d     Det. By:  U/S C R L  (08/01/20)    EDD:   02/18/21 ---------------------------------------------------------------------- Anatomy  Cranium:               Appears normal         LVOT:                   Not well  visualized  Cavum:                 Appears normal         Aortic Arch:            Appears normal  Ventricles:            Appears normal         Ductal Arch:            Not well visualized  Choroid Plexus:        Appears normal         Diaphragm:              Appears normal  Cerebellum:            Appears normal         Stomach:                Appears normal, left                                                                         sided  Posterior Fossa:       Appears normal         Abdomen:                Appears normal  Nuchal Fold:           Appears normal         Abdominal Wall:         Not well visualized  Face:                  Appears normal         Cord Vessels:           Appears normal (3                         (orbits and profile)                           vessel cord)  Lips:                  Appears normal         Kidneys:                Appear normal  Palate:                Not well visualized    Bladder:                Appears normal  Thoracic:              Appears normal         Spine:                  Ltd views no  intracranial signs of                                                                        NTD  Heart:                 Not well visualized    Upper Extremities:      Appears normal  RVOT:                  Appears normal         Lower Extremities:      Appears normal  Other:  Lenses visualized. Nasal bone visualized. Fetus appears to be a female. ---------------------------------------------------------------------- Cervix Uterus Adnexa  Cervix  Length:           3.69  cm.  Normal appearance by transabdominal scan.  Uterus  No abnormality visualized.  Right Ovary  Not visualized.  Left Ovary  Not visualized.  Cul De Sac  No free fluid seen.  Adnexa  No adnexal mass visualized. ---------------------------------------------------------------------- Comments  This patient was seen for a detailed fetal anatomy scan.  She denies any significant past medical history and denies  any problems in her current pregnancy.  She had a cell free DNA test earlier in her pregnancy which  indicated a low risk for trisomy 28, 47, and 13. A female fetus is  predicted.  She was informed that the fetal growth and amniotic fluid  level were appropriate for her gestational age.  There were no obvious fetal anomalies noted on today's  ultrasound exam.  However, the views of  the fetal anatomy  were limited today due to the fetal position.  The patient was informed that anomalies may be missed due  to technical limitations. If the fetus is in a suboptimal position  or maternal habitus is increased, visualization of the fetus in  the maternal uterus may be impaired.  A follow-up exam was scheduled in 4 weeks to complete the  views of the fetal anatomy. ----------------------------------------------------------------------                   Ma Rings, MD Electronically Signed Final Report   09/25/2020 12:28 pm ----------------------------------------------------------------------   Assessment and Plan:  Pregnancy: G1P0 at [redacted]w[redacted]d 1. Supervision of normal first pregnancy, antepartum Reviewed her desire to fast from food and fluids from sun up to sun down during [place].  While planning to have intake after sundown, reviewed that with her up and moving during the day, pregnancy presents extra energy needs and becoming faint or dizzy is a possibility.  Reviewed possible signs of near syncope and advised to sit or lie down immediately.  Advised to monitor her body responses and she may need fluids as she is working in the day.   Will arrange a lab only visit for AFP lab draw. Will begin to take BP weekly and add into babyscripts Has had 2 Covid vaccines and advised that Booster is recommended in pregnancy.  Is planning to get. Advised to investigate childbirth and breastfeeding classes on [facility] website. Reviewed previous problem list items from other providers.  Has had problems with Anxiety and depression in the past.  Reviewed the resources available in the office in pregnancy  and postpartum and to call if she has any symptoms so they can be managed in a timely way.  Is taking OTC meds for allergies from time to time.  Has seen a neurologist in the past for migraines.  Is not taking anything but tylenol now for headaches and is managing without problems.  Has never had  aura. Reviewed [place] on April 26.   Preterm labor symptoms and general obstetric precautions including but not limited to vaginal bleeding, contractions, leaking of fluid and fetal movement were reviewed in detail with the patient. I discussed the assessment and treatment plan with the patient. The patient was provided an opportunity to ask questions and all were answered. The patient agreed with the plan and demonstrated an understanding of the instructions. The patient was advised to call back or seek an in-person office evaluation/go to [facility] at [facility] for any urgent or concerning symptoms. Please refer to After Visit Summary for other counseling recommendations.   I provided 10 minutes of face-to-face time during this encounter.    Future Appointments  Date Time Provider Department Center  10/23/2020  1:45 PM [facility] NURSE [facility]  10/23/2020  2:00 PM [facility]    Currie Paris, NP [facility] for [employer], [facility]

## 2020-09-28 NOTE — Progress Notes (Signed)
ROB [redacted]w[redacted]d Virtual visit.  Pt able to check B/P while on the phone.  CC: None

## 2020-10-23 ENCOUNTER — Ambulatory Visit: Payer: BC Managed Care – PPO

## 2020-10-24 ENCOUNTER — Encounter: Payer: Self-pay | Admitting: *Deleted

## 2020-10-24 ENCOUNTER — Ambulatory Visit: Payer: BC Managed Care – PPO | Admitting: *Deleted

## 2020-10-24 ENCOUNTER — Other Ambulatory Visit: Payer: Self-pay

## 2020-10-24 ENCOUNTER — Ambulatory Visit: Payer: BC Managed Care – PPO | Attending: Obstetrics

## 2020-10-24 DIAGNOSIS — Z3400 Encounter for supervision of normal first pregnancy, unspecified trimester: Secondary | ICD-10-CM | POA: Insufficient documentation

## 2020-10-24 DIAGNOSIS — Z362 Encounter for other antenatal screening follow-up: Secondary | ICD-10-CM | POA: Diagnosis not present

## 2020-10-24 DIAGNOSIS — Z363 Encounter for antenatal screening for malformations: Secondary | ICD-10-CM

## 2020-10-24 DIAGNOSIS — Z3A23 23 weeks gestation of pregnancy: Secondary | ICD-10-CM

## 2020-11-06 ENCOUNTER — Encounter: Payer: BC Managed Care – PPO | Admitting: Advanced Practice Midwife

## 2020-11-15 ENCOUNTER — Encounter: Payer: Self-pay | Admitting: Obstetrics and Gynecology

## 2020-11-15 ENCOUNTER — Other Ambulatory Visit: Payer: Self-pay

## 2020-11-15 ENCOUNTER — Ambulatory Visit (INDEPENDENT_AMBULATORY_CARE_PROVIDER_SITE_OTHER): Payer: BC Managed Care – PPO | Admitting: Obstetrics and Gynecology

## 2020-11-15 DIAGNOSIS — Z3400 Encounter for supervision of normal first pregnancy, unspecified trimester: Secondary | ICD-10-CM

## 2020-11-15 NOTE — Progress Notes (Signed)
Subjective:  Nicole Clay is a 30 y.o. G1P0 at [redacted]w[redacted]d being seen today for ongoing prenatal care.  She is currently monitored for the following issues for this low-risk pregnancy and has Supervision of normal first pregnancy, antepartum; Anxiety; Depression; Migraine without aura and without status migrainosus, not intractable; Insomnia; and Seasonal allergic rhinitis due to pollen on their problem list.  Patient reports no complaints.  Contractions: Not present. Vag. Bleeding: None.  Movement: Present. Denies leaking of fluid.   The following portions of the patient's history were reviewed and updated as appropriate: allergies, current medications, past family history, past medical history, past social history, past surgical history and problem list. Problem list updated.  Objective:   Vitals:   11/15/20 1427  BP: 120/80  Pulse: 69  Weight: 153 lb 8 oz (69.6 kg)    Fetal Status: Fetal Heart Rate (bpm): 139   Movement: Present     General:  Alert, oriented and cooperative. Patient is in no acute distress.  Skin: Skin is warm and dry. No rash noted.   Cardiovascular: Normal heart rate noted  Respiratory: Normal respiratory effort, no problems with respiration noted  Abdomen: Soft, gravid, appropriate for gestational age. Pain/Pressure: Present     Pelvic:  Cervical exam deferred        Extremities: Normal range of motion.  Edema: None  Mental Status: Normal mood and affect. Normal behavior. Normal judgment and thought content.   Urinalysis:      Assessment and Plan:  Pregnancy: G1P0 at [redacted]w[redacted]d  1. Supervision of normal first pregnancy, antepartum Stable To late for AFP Glucola next visit  Preterm labor symptoms and general obstetric precautions including but not limited to vaginal bleeding, contractions, leaking of fluid and fetal movement were reviewed in detail with the patient. Please refer to After Visit Summary for other counseling recommendations.  Return in about 2 weeks  (around 11/29/2020) for OB visit, face to face, any provider, fasting for Glucola.   Hermina Staggers, MD

## 2020-11-15 NOTE — Progress Notes (Signed)
Pt reports fetal movement with some pressure. 

## 2020-11-15 NOTE — Patient Instructions (Signed)

## 2020-11-30 ENCOUNTER — Other Ambulatory Visit: Payer: Self-pay

## 2020-11-30 ENCOUNTER — Other Ambulatory Visit: Payer: Self-pay | Admitting: Obstetrics and Gynecology

## 2020-11-30 ENCOUNTER — Ambulatory Visit (INDEPENDENT_AMBULATORY_CARE_PROVIDER_SITE_OTHER): Payer: BC Managed Care – PPO | Admitting: Obstetrics and Gynecology

## 2020-11-30 ENCOUNTER — Other Ambulatory Visit: Payer: BC Managed Care – PPO

## 2020-11-30 VITALS — BP 113/77 | HR 70 | Wt 152.2 lb

## 2020-11-30 DIAGNOSIS — Z3400 Encounter for supervision of normal first pregnancy, unspecified trimester: Secondary | ICD-10-CM

## 2020-11-30 DIAGNOSIS — Z3401 Encounter for supervision of normal first pregnancy, first trimester: Secondary | ICD-10-CM | POA: Diagnosis not present

## 2020-11-30 DIAGNOSIS — Z23 Encounter for immunization: Secondary | ICD-10-CM

## 2020-11-30 NOTE — Progress Notes (Signed)
   PRENATAL VISIT NOTE  Subjective:  Nicole Clay is a 30 y.o. G1P0 at [redacted]w[redacted]d being seen today for ongoing prenatal care.  She is currently monitored for the following issues for this low-risk pregnancy and has Supervision of normal first pregnancy, antepartum; Anxiety; Depression; Migraine without aura and without status migrainosus, not intractable; Insomnia; and Seasonal allergic rhinitis due to pollen on their problem list.  Patient reports no complaints.  Contractions: Not present. Vag. Bleeding: None.  Movement: Present. Denies leaking of fluid.   The following portions of the patient's history were reviewed and updated as appropriate: allergies, current medications, past family history, past medical history, past social history, past surgical history and problem list.   Objective:   Vitals:   11/30/20 0924  BP: 113/77  Pulse: 70  Weight: 152 lb 3.2 oz (69 kg)    Fetal Status:   Fundal Height: 29 cm Movement: Present     General:  Alert, oriented and cooperative. Patient is in no acute distress.  Skin: Skin is warm and dry. No rash noted.   Cardiovascular: Normal heart rate noted  Respiratory: Normal respiratory effort, no problems with respiration noted  Abdomen: Soft, gravid, appropriate for gestational age.  Pain/Pressure: Present     Pelvic: Cervical exam deferred        Extremities: Normal range of motion.  Edema: None  Mental Status: Normal mood and affect. Normal behavior. Normal judgment and thought content.   Assessment and Plan:  Pregnancy: G1P0 at [redacted]w[redacted]d  1. Encounter for supervision of normal first pregnancy in first trimester - RPR - CBC - Glucose Tolerance, 2 Hours w/1 Hour - HIV Antibody (routine testing w rflx) - Tdap today.  2. Supervision of normal first pregnancy, antepartum   Preterm labor symptoms and general obstetric precautions including but not limited to vaginal bleeding, contractions, leaking of fluid and fetal movement were reviewed in detail  with the patient. Please refer to After Visit Summary for other counseling recommendations.   No follow-ups on file.  No future appointments.  Venia Carbon, NP

## 2020-12-01 LAB — GLUCOSE TOLERANCE, 2 HOURS W/ 1HR
Glucose, 1 hour: 142 mg/dL (ref 65–179)
Glucose, 2 hour: 83 mg/dL (ref 65–152)
Glucose, Fasting: 80 mg/dL (ref 65–91)

## 2020-12-01 LAB — CBC
Hematocrit: 31.4 % — ABNORMAL LOW (ref 34.0–46.6)
Hemoglobin: 10.6 g/dL — ABNORMAL LOW (ref 11.1–15.9)
MCH: 29.3 pg (ref 26.6–33.0)
MCHC: 33.8 g/dL (ref 31.5–35.7)
MCV: 87 fL (ref 79–97)
Platelets: 315 10*3/uL (ref 150–450)
RBC: 3.62 x10E6/uL — ABNORMAL LOW (ref 3.77–5.28)
RDW: 13 % (ref 11.7–15.4)
WBC: 11.7 10*3/uL — ABNORMAL HIGH (ref 3.4–10.8)

## 2020-12-01 LAB — HIV ANTIBODY (ROUTINE TESTING W REFLEX): HIV Screen 4th Generation wRfx: NONREACTIVE

## 2020-12-01 LAB — RPR: RPR Ser Ql: NONREACTIVE

## 2020-12-14 ENCOUNTER — Ambulatory Visit (INDEPENDENT_AMBULATORY_CARE_PROVIDER_SITE_OTHER): Payer: BC Managed Care – PPO | Admitting: Nurse Practitioner

## 2020-12-14 ENCOUNTER — Other Ambulatory Visit: Payer: Self-pay

## 2020-12-14 VITALS — BP 118/70 | HR 90 | Wt 152.8 lb

## 2020-12-14 DIAGNOSIS — R1011 Right upper quadrant pain: Secondary | ICD-10-CM

## 2020-12-14 DIAGNOSIS — Z3400 Encounter for supervision of normal first pregnancy, unspecified trimester: Secondary | ICD-10-CM

## 2020-12-14 DIAGNOSIS — Z3A30 30 weeks gestation of pregnancy: Secondary | ICD-10-CM

## 2020-12-14 DIAGNOSIS — F419 Anxiety disorder, unspecified: Secondary | ICD-10-CM

## 2020-12-14 NOTE — Progress Notes (Signed)
Pt reports fetal movement, states that she feels like she pulled a muscle on her right side.

## 2020-12-14 NOTE — Progress Notes (Signed)
    Subjective:  Nicole Clay is a 30 y.o. G1P0 at [redacted]w[redacted]d being seen today for ongoing prenatal care.  She is currently monitored for the following issues for this low-risk pregnancy and has Supervision of normal first pregnancy, antepartum; Anxiety; Depression; Migraine without aura and without status migrainosus, not intractable; Insomnia; and Seasonal allergic rhinitis due to pollen on their problem list.  Patient reports  RUQ pain which she thinks is a pulled muscle .  Contractions: Not present. Vag. Bleeding: None.  Movement: Present. Denies leaking of fluid.   The following portions of the patient's history were reviewed and updated as appropriate: allergies, current medications, past family history, past medical history, past social history, past surgical history and problem list. Problem list updated.  Objective:   Vitals:   12/14/20 0947  BP: 118/70  Pulse: 90  Weight: 152 lb 12.8 oz (69.3 kg)    Fetal Status: Fetal Heart Rate (bpm): 138 Fundal Height: 31 cm Movement: Present     General:  Alert, oriented and cooperative. Patient is in no acute distress.  Skin: Skin is warm and dry. No rash noted.   Cardiovascular: Normal heart rate noted  Respiratory: Normal respiratory effort, no problems with respiration noted  Abdomen: Soft, gravid, appropriate for gestational age. Pain/Pressure: Absent     Pelvic:  Cervical exam deferred        Extremities: Normal range of motion.  Edema: None  Mental Status: Normal mood and affect. Normal behavior. Normal judgment and thought content.   Urinalysis:      Assessment and Plan:  Pregnancy: G1P0 at [redacted]w[redacted]d  1. Supervision of normal first pregnancy, antepartum Advised to sign up for childbirth and breastfeeding classes Reviewed selecting a pediatrician.  2. Anxiety Works at [employer].  Is aware of her history but does not think this is a problem for her at this time.  3. RUQ pain BP is normal so would not think it is related to  preeclampsia and liver edema, but discussed with patient.  Expecting pain to resolve but will notify us if it is worsening.   Preterm labor symptoms and general obstetric precautions including but not limited to vaginal bleeding, contractions, leaking of fluid and fetal movement were reviewed in detail with the patient. Please refer to After Visit Summary for other counseling recommendations.  Return in about 2 weeks (around 12/28/2020) for in person ROB.  Nolene Bernheim, RN, MSN, NP-BC Nurse Practitioner, [facility] for [employer], [facility] 12/14/2020 10:28 AM

## 2020-12-28 ENCOUNTER — Ambulatory Visit (INDEPENDENT_AMBULATORY_CARE_PROVIDER_SITE_OTHER): Payer: BC Managed Care – PPO | Admitting: Obstetrics

## 2020-12-28 ENCOUNTER — Encounter: Payer: Self-pay | Admitting: Obstetrics

## 2020-12-28 ENCOUNTER — Other Ambulatory Visit: Payer: Self-pay

## 2020-12-28 VITALS — BP 113/74 | HR 69 | Wt 154.7 lb

## 2020-12-28 DIAGNOSIS — Z3401 Encounter for supervision of normal first pregnancy, first trimester: Secondary | ICD-10-CM

## 2020-12-28 DIAGNOSIS — R109 Unspecified abdominal pain: Secondary | ICD-10-CM

## 2020-12-28 DIAGNOSIS — O26899 Other specified pregnancy related conditions, unspecified trimester: Secondary | ICD-10-CM

## 2020-12-28 NOTE — Progress Notes (Addendum)
Subjective:  Nicole Clay is a 30 y.o. G1P0 at [redacted]w[redacted]d being seen today for ongoing prenatal care.  She is currently monitored for the following issues for this low-risk pregnancy and has Supervision of normal first pregnancy, antepartum; Anxiety; Depression; Migraine without aura and without status migrainosus, not intractable; Insomnia; and Seasonal allergic rhinitis due to pollen on their problem list.  Patient reports  right upper quadrant abdominal pain.  Contractions: Not present. Vag. Bleeding: None.  Movement: Present. Denies leaking of fluid.   The following portions of the patient's history were reviewed and updated as appropriate: allergies, current medications, past family history, past medical history, past social history, past surgical history and problem list. Problem list updated.  Objective:   Vitals:   12/28/20 0815  BP: 113/74  Pulse: 69  Weight: 154 lb 11.8 oz (70.2 kg)    Fetal Status:     Movement: Present     General:  Alert, oriented and cooperative. Patient is in no acute distress.  Skin: Skin is warm and dry. No rash noted.   Cardiovascular: Normal heart rate noted  Respiratory: Normal respiratory effort, no problems with respiration noted  Abdomen: Soft, gravid, appropriate for gestational age. Right costovertebral angle tenderness.  + Murphy's  Pelvic:  Cervical exam deferred        Extremities: Normal range of motion.  Edema: None  Mental Status: Normal mood and affect. Normal behavior. Normal judgment and thought content.   Urinalysis:      Assessment and Plan:  Pregnancy: G1P0 at [redacted]w[redacted]d  1. Encounter for supervision of normal first pregnancy in first trimester  2. Abdominal pain complicating pregnancy.  + Murphy's sign.  R/O cholecystitis Rx: - US Abdomen Complete; Future   Preterm labor symptoms and general obstetric precautions including but not limited to vaginal bleeding, contractions, leaking of fluid and fetal movement were reviewed in detail  with the patient. Please refer to After Visit Summary for other counseling recommendations.   Return in about 2 weeks (around 01/11/2021) for ROB.  Brock Bad, MD 12/28/2020 8:44 AM

## 2020-12-28 NOTE — Progress Notes (Signed)
Pt presents for ROB c/o RUQ pain.

## 2021-01-10 ENCOUNTER — Other Ambulatory Visit: Payer: Self-pay

## 2021-01-10 ENCOUNTER — Ambulatory Visit (HOSPITAL_COMMUNITY)
Admission: RE | Admit: 2021-01-10 | Discharge: 2021-01-10 | Disposition: A | Discharge status: Home / Self Care | Payer: BC Managed Care – PPO | Source: Ambulatory Visit | Attending: Obstetrics | Admitting: Obstetrics

## 2021-01-10 DIAGNOSIS — O26899 Other specified pregnancy related conditions, unspecified trimester: Secondary | ICD-10-CM | POA: Diagnosis not present

## 2021-01-10 DIAGNOSIS — R109 Unspecified abdominal pain: Secondary | ICD-10-CM | POA: Insufficient documentation

## 2021-01-10 DIAGNOSIS — R1011 Right upper quadrant pain: Secondary | ICD-10-CM | POA: Diagnosis not present

## 2021-01-11 ENCOUNTER — Encounter: Payer: Self-pay | Admitting: Obstetrics and Gynecology

## 2021-01-11 ENCOUNTER — Ambulatory Visit (INDEPENDENT_AMBULATORY_CARE_PROVIDER_SITE_OTHER): Payer: BC Managed Care – PPO | Admitting: Obstetrics and Gynecology

## 2021-01-11 VITALS — BP 118/76 | HR 88 | Wt 156.0 lb

## 2021-01-11 DIAGNOSIS — O99343 Other mental disorders complicating pregnancy, third trimester: Secondary | ICD-10-CM

## 2021-01-11 DIAGNOSIS — F32A Depression, unspecified: Secondary | ICD-10-CM

## 2021-01-11 DIAGNOSIS — Z3400 Encounter for supervision of normal first pregnancy, unspecified trimester: Secondary | ICD-10-CM

## 2021-01-11 NOTE — Progress Notes (Signed)
   PRENATAL VISIT NOTE  Subjective:  Nicole Clay is a 30 y.o. G1P0 at [redacted]w[redacted]d being seen today for ongoing prenatal care.  She is currently monitored for the following issues for this low-risk pregnancy and has Supervision of normal first pregnancy, antepartum; Anxiety; Depression; Migraine without aura and without status migrainosus, not intractable; Insomnia; and Seasonal allergic rhinitis due to pollen on their problem list.  Patient reports no complaints.  Contractions: Not present. Vag. Bleeding: None.  Movement: Present. Denies leaking of fluid.   The following portions of the patient's history were reviewed and updated as appropriate: allergies, current medications, past family history, past medical history, past social history, past surgical history and problem list.   Objective:   Vitals:   01/11/21 0818  BP: 118/76  Pulse: 88  Weight: 156 lb (70.8 kg)    Fetal Status: Fetal Heart Rate (bpm): 132 Fundal Height: 35 cm Movement: Present     General:  Alert, oriented and cooperative. Patient is in no acute distress.  Skin: Skin is warm and dry. No rash noted.   Cardiovascular: Normal heart rate noted  Respiratory: Normal respiratory effort, no problems with respiration noted  Abdomen: Soft, gravid, appropriate for gestational age.  Pain/Pressure: Present     Pelvic: Cervical exam deferred        Extremities: Normal range of motion.  Edema: None  Mental Status: Normal mood and affect. Normal behavior. Normal judgment and thought content.   Assessment and Plan:  Pregnancy: G1P0 at [redacted]w[redacted]d 1. Supervision of normal first pregnancy, antepartum Patient is doing well without complaints She reports persistent RUQ pain describing it as a dull pain which worsen with certain movements. Patient states pain is not related to food intake RUQ ultrasound report pending Cultures next visit  2. Depression during pregnancy in third trimester Stable  Preterm labor symptoms and general  obstetric precautions including but not limited to vaginal bleeding, contractions, leaking of fluid and fetal movement were reviewed in detail with the patient. Please refer to After Visit Summary for other counseling recommendations.   Return in about 2 weeks (around 01/25/2021) for in person, ROB, Low risk.  No future appointments.  Catalina Antigua, MD

## 2021-01-11 NOTE — Progress Notes (Signed)
+   fetal movement. Pt still c/o RUQ pain. States she went for u/s yesterday.

## 2021-01-25 ENCOUNTER — Encounter: Payer: BC Managed Care – PPO | Admitting: Obstetrics

## 2021-01-29 ENCOUNTER — Encounter: Payer: Self-pay | Admitting: Family Medicine

## 2021-01-29 ENCOUNTER — Other Ambulatory Visit (HOSPITAL_COMMUNITY)
Admission: RE | Admit: 2021-01-29 | Discharge: 2021-01-29 | Disposition: A | Discharge status: Home / Self Care | Payer: BC Managed Care – PPO | Source: Ambulatory Visit | Attending: Obstetrics | Admitting: Obstetrics

## 2021-01-29 ENCOUNTER — Other Ambulatory Visit: Payer: Self-pay

## 2021-01-29 ENCOUNTER — Ambulatory Visit (INDEPENDENT_AMBULATORY_CARE_PROVIDER_SITE_OTHER): Payer: BC Managed Care – PPO | Admitting: Family Medicine

## 2021-01-29 VITALS — BP 126/77 | HR 83 | Wt 158.0 lb

## 2021-01-29 DIAGNOSIS — Z3A37 37 weeks gestation of pregnancy: Secondary | ICD-10-CM | POA: Diagnosis not present

## 2021-01-29 DIAGNOSIS — Z3403 Encounter for supervision of normal first pregnancy, third trimester: Secondary | ICD-10-CM | POA: Diagnosis not present

## 2021-01-29 DIAGNOSIS — Z3400 Encounter for supervision of normal first pregnancy, unspecified trimester: Secondary | ICD-10-CM | POA: Diagnosis not present

## 2021-01-29 NOTE — Progress Notes (Signed)
   PRENATAL VISIT NOTE  Subjective:  Nicole Clay is a 30 y.o. G1P0 at [redacted]w[redacted]d being seen today for ongoing prenatal care.  She is currently monitored for the following issues for this low-risk pregnancy and has Supervision of normal first pregnancy, antepartum; Anxiety; Depression; Migraine without aura and without status migrainosus, not intractable; Insomnia; and Seasonal allergic rhinitis due to pollen on their problem list.  Patient reports no complaints.  Contractions: Not present. Vag. Bleeding: None.  Movement: Present. Denies leaking of fluid.   The following portions of the patient's history were reviewed and updated as appropriate: allergies, current medications, past family history, past medical history, past social history, past surgical history and problem list.   Objective:   Vitals:   01/29/21 1533  BP: 126/77  Pulse: 83  Weight: 158 lb (71.7 kg)    Fetal Status: Fetal Heart Rate (bpm): 128 Fundal Height: 37 cm Movement: Present     General:  Alert, oriented and cooperative. Patient is in no acute distress.  Skin: Skin is warm and dry. No rash noted.   Cardiovascular: Normal heart rate and regular rhythm.   Respiratory: Normal respiratory effort.  Abdomen: Soft, gravid, appropriate for gestational age.  Pain/Pressure: Absent.     Pelvic: Cervical exam deferred. Normal vulva with normal appearing vaginal discharge present.     Extremities: Normal range of motion.  Edema: None.  Mental Status: Normal mood and affect. Normal behavior. Normal judgment and thought content.   Assessment and Plan:  Pregnancy: G1P0 at [redacted]w[redacted]d  1. Supervision of normal first pregnancy, antepartum 2. [redacted] weeks gestation of pregnancy Patient is doing well and progressing as expected. No concerns or complaints today. Vaginal swab and GBS testing obtained. Will follow up results. Follow up for next prenatal visit in 1 week. - Cervicovaginal ancillary only( [place]) - Culture, beta strep (group b  only)  Labor symptoms and general obstetric precautions including but not limited to vaginal bleeding, contractions, leaking of fluid and fetal movement were reviewed in detail with the patient.  Please refer to After Visit Summary for other counseling recommendations.   Return in about 1 week (around 02/05/2021) for follow up OB visit.  Future Appointments  Date Time Provider Department Center  02/07/2021  1:30 PM Conan Bowens, MD [facility] None    Worthy Rancher, MD Obstetrics Fellow 01/29/21 4:11 PM

## 2021-01-29 NOTE — Progress Notes (Signed)
+   Fetal movement. No complaints.  

## 2021-01-30 LAB — CERVICOVAGINAL ANCILLARY ONLY
Chlamydia: NEGATIVE
Comment: NEGATIVE
Comment: NEGATIVE
Comment: NORMAL
Neisseria Gonorrhea: NEGATIVE
Trichomonas: NEGATIVE

## 2021-02-02 LAB — CULTURE, BETA STREP (GROUP B ONLY): Strep Gp B Culture: NEGATIVE

## 2021-02-07 ENCOUNTER — Other Ambulatory Visit: Payer: Self-pay

## 2021-02-07 ENCOUNTER — Encounter: Payer: Self-pay | Admitting: Obstetrics and Gynecology

## 2021-02-07 ENCOUNTER — Ambulatory Visit (INDEPENDENT_AMBULATORY_CARE_PROVIDER_SITE_OTHER): Payer: BC Managed Care – PPO | Admitting: Obstetrics and Gynecology

## 2021-02-07 VITALS — BP 122/72 | HR 76 | Wt 161.0 lb

## 2021-02-07 DIAGNOSIS — Z3A38 38 weeks gestation of pregnancy: Secondary | ICD-10-CM

## 2021-02-07 DIAGNOSIS — Z3400 Encounter for supervision of normal first pregnancy, unspecified trimester: Secondary | ICD-10-CM

## 2021-02-07 NOTE — Progress Notes (Signed)
   PRENATAL VISIT NOTE  Subjective:  Nicole Clay is a 30 y.o. G1P0 at [redacted]w[redacted]d being seen today for ongoing prenatal care.  She is currently monitored for the following issues for this low-risk pregnancy and has Supervision of normal first pregnancy, antepartum; Anxiety; Depression; Migraine without aura and without status migrainosus, not intractable; Insomnia; and Seasonal allergic rhinitis due to pollen on their problem list.  Patient reports no complaints.  Contractions: Not present. Vag. Bleeding: None.  Movement: Present. Denies leaking of fluid.   The following portions of the patient's history were reviewed and updated as appropriate: allergies, current medications, past family history, past medical history, past social history, past surgical history and problem list.   Objective:   Vitals:   02/07/21 1328  BP: 122/72  Pulse: 76  Weight: 161 lb (73 kg)    Fetal Status: Fetal Heart Rate (bpm): 125   Movement: Present    Cephalic by presentation  General:  Alert, oriented and cooperative. Patient is in no acute distress.  Skin: Skin is warm and dry. No rash noted.   Cardiovascular: Normal heart rate noted  Respiratory: Normal respiratory effort, no problems with respiration noted  Abdomen: Soft, gravid, appropriate for gestational age.  Pain/Pressure: Present     Pelvic: Cervical exam deferred        Extremities: Normal range of motion.     Mental Status: Normal mood and affect. Normal behavior. Normal judgment and thought content.   Assessment and Plan:  Pregnancy: G1P0 at [redacted]w[redacted]d  1. Supervision of normal first pregnancy, antepartum - reviewed contraception options, she is leaning toward IUD  2. [redacted] weeks gestation of pregnancy   Term labor symptoms and general obstetric precautions including but not limited to vaginal bleeding, contractions, leaking of fluid and fetal movement were reviewed in detail with the patient. Please refer to After Visit Summary for other  counseling recommendations.   Return in about 1 week (around 02/14/2021) for low OB, in person.  No future appointments.  Conan Bowens, MD

## 2021-02-07 NOTE — Patient Instructions (Signed)
Use the following websites (and others) to help learn more about your contraception options and find the method that is right for you!  - The Centers for Disease Control (CDC) website: https://www.cdc.gov/reproductivehealth/contraception/index.htm  - [facility] website: https://www.[facility]renthood.org/learn/birth-control  - [employer]org: https://www.bedsider.org/methods   Go to bedsider.org for more information!  Contraception Choices Contraception, also called birth control, refers to methods or devices that prevent pregnancy. Hormonal methods Contraceptive implant A contraceptive implant is a thin, plastic tube that contains a hormone. It is inserted into the upper part of the arm. It can remain in place for up to 3 years. Progestin-only injections Progestin-only injections are injections of progestin, a synthetic form of the hormone progesterone. They are given every 3 months by a health care provider. Birth control pills Birth control pills are pills that contain hormones that prevent pregnancy. They must be taken once a day, preferably at the same time each day. Birth control patch The birth control patch contains hormones that prevent pregnancy. It is placed on the skin and must be changed once a week for three weeks and removed on the fourth week. A prescription is needed to use this method of contraception. Vaginal ring A vaginal ring contains hormones that prevent pregnancy. It is placed in the vagina for three weeks and removed on the fourth week. After that, the process is repeated with a new ring. A prescription is needed to use this method of contraception. Emergency contraceptive Emergency contraceptives prevent pregnancy after unprotected sex. They come in pill form and can be taken up to 5 days after sex. They work best the sooner they are taken after having sex. Most emergency contraceptives are available without a prescription. This method should not be used as  your only form of birth control. Barrier methods Female condom A female condom is a thin sheath that is worn over the penis during sex. Condoms keep sperm from going inside a woman's body. They can be used with a spermicide to increase their effectiveness. They should be disposed after a single use. Female condom A female condom is a soft, loose-fitting sheath that is put into the vagina before sex. The condom keeps sperm from going inside a woman's body. They should be disposed after a single use.  Intrauterine contraception Intrauterine device (IUD) An IUD is a T-shaped device that is put in a woman's uterus. There are two types:  Hormone IUD.This type contains progestin, a synthetic form of the hormone progesterone. This type can stay in place for 3-5 years.  Copper IUD.This type is wrapped in copper wire. It can stay in place for 10 years.  Permanent methods of contraception Female tubal ligation In this method, a woman's fallopian tubes are sealed, tied, or blocked during surgery to prevent eggs from traveling to the uterus.  Female sterilization This is a procedure to tie off the tubes that carry sperm (vasectomy). After the procedure, the man can still ejaculate fluid (semen).  Summary  Contraception, also called birth control, means methods or devices that prevent pregnancy.  Hormonal methods of contraception include implants, injections, pills, patches, vaginal rings, and emergency contraceptives.  Barrier methods of contraception can include female condoms, female condoms, diaphragms, cervical caps, sponges, and spermicides.  There are two types of IUDs (intrauterine devices). An IUD can be put in a woman's uterus to prevent pregnancy for 3-5 years.  Permanent sterilization can be done through a procedure for males, females, or both. This information is not intended to replace advice given to you   by your health care provider. Make sure you discuss any questions you have with your  health care provider. Document Released: 06/16/2005 Document Revised: 07/19/2016 Document Reviewed: 07/19/2016 Elsevier Interactive Patient Education  2018 [employer].  

## 2021-02-13 ENCOUNTER — Ambulatory Visit (INDEPENDENT_AMBULATORY_CARE_PROVIDER_SITE_OTHER): Payer: BC Managed Care – PPO | Admitting: Obstetrics

## 2021-02-13 ENCOUNTER — Encounter: Payer: Self-pay | Admitting: Obstetrics

## 2021-02-13 ENCOUNTER — Other Ambulatory Visit: Payer: Self-pay

## 2021-02-13 DIAGNOSIS — Z3400 Encounter for supervision of normal first pregnancy, unspecified trimester: Secondary | ICD-10-CM

## 2021-02-13 NOTE — Progress Notes (Signed)
Pt presents for ROB requests cx check today.  

## 2021-02-13 NOTE — Progress Notes (Signed)
Subjective:  Nicole Clay is a 30 y.o. G1P0 at [redacted]w[redacted]d being seen today for ongoing prenatal care.  She is currently monitored for the following issues for this low-risk pregnancy and has Supervision of normal first pregnancy, antepartum; Anxiety; Depression; Migraine without aura and without status migrainosus, not intractable; Insomnia; and Seasonal allergic rhinitis due to pollen on their problem list.  Patient reports backache.  Contractions: Not present. Vag. Bleeding: None.  Movement: Present. Denies leaking of fluid.   The following portions of the patient's history were reviewed and updated as appropriate: allergies, current medications, past family history, past medical history, past social history, past surgical history and problem list. Problem list updated.  Objective:   Vitals:   02/13/21 1619  BP: 131/70  Pulse: 71  Weight: 158 lb 8 oz (71.9 kg)    Fetal Status:     Movement: Present     General:  Alert, oriented and cooperative. Patient is in no acute distress.  Skin: Skin is warm and dry. No rash noted.   Cardiovascular: Normal heart rate noted  Respiratory: Normal respiratory effort, no problems with respiration noted  Abdomen: Soft, gravid, appropriate for gestational age. Pain/Pressure: Present     Pelvic:  Cervical exam performed      2cm / 50% / -2 / Vtx  Extremities: Normal range of motion.  Edema: None  Mental Status: Normal mood and affect. Normal behavior. Normal judgment and thought content.   Urinalysis:      Assessment and Plan:  Pregnancy: G1P0 at [redacted]w[redacted]d  1. Supervision of normal first pregnancy, antepartum    Term labor symptoms and general obstetric precautions including but not limited to vaginal bleeding, contractions, leaking of fluid and fetal movement were reviewed in detail with the patient. Please refer to After Visit Summary for other counseling recommendations.   Return in about 1 week (around 02/20/2021) for ROB.   Brock Bad, MD   02/13/21

## 2021-02-16 ENCOUNTER — Inpatient Hospital Stay (HOSPITAL_COMMUNITY): Payer: BC Managed Care – PPO | Admitting: Anesthesiology

## 2021-02-16 ENCOUNTER — Other Ambulatory Visit: Payer: Self-pay

## 2021-02-16 ENCOUNTER — Encounter (HOSPITAL_COMMUNITY): Payer: Self-pay | Admitting: Obstetrics and Gynecology

## 2021-02-16 ENCOUNTER — Inpatient Hospital Stay (HOSPITAL_COMMUNITY)
Admission: AD | Admit: 2021-02-16 | Discharge: 2021-02-19 | DRG: 788 | Disposition: A | Discharge status: Home / Self Care | Payer: BC Managed Care – PPO | Attending: Obstetrics and Gynecology | Admitting: Obstetrics and Gynecology

## 2021-02-16 DIAGNOSIS — O99334 Smoking (tobacco) complicating childbirth: Secondary | ICD-10-CM | POA: Diagnosis present

## 2021-02-16 DIAGNOSIS — F419 Anxiety disorder, unspecified: Secondary | ICD-10-CM | POA: Diagnosis present

## 2021-02-16 DIAGNOSIS — F17200 Nicotine dependence, unspecified, uncomplicated: Secondary | ICD-10-CM | POA: Diagnosis present

## 2021-02-16 DIAGNOSIS — Z20822 Contact with and (suspected) exposure to covid-19: Secondary | ICD-10-CM | POA: Diagnosis present

## 2021-02-16 DIAGNOSIS — O321XX0 Maternal care for breech presentation, not applicable or unspecified: Principal | ICD-10-CM | POA: Diagnosis present

## 2021-02-16 DIAGNOSIS — O34211 Maternal care for low transverse scar from previous cesarean delivery: Secondary | ICD-10-CM | POA: Diagnosis not present

## 2021-02-16 DIAGNOSIS — Z3400 Encounter for supervision of normal first pregnancy, unspecified trimester: Secondary | ICD-10-CM

## 2021-02-16 DIAGNOSIS — Z3A39 39 weeks gestation of pregnancy: Secondary | ICD-10-CM | POA: Diagnosis not present

## 2021-02-16 DIAGNOSIS — O26893 Other specified pregnancy related conditions, third trimester: Secondary | ICD-10-CM | POA: Diagnosis not present

## 2021-02-16 DIAGNOSIS — F32A Depression, unspecified: Secondary | ICD-10-CM | POA: Diagnosis present

## 2021-02-16 HISTORY — DX: Anemia, unspecified: D64.9

## 2021-02-16 LAB — CBC
HCT: 32.9 % — ABNORMAL LOW (ref 36.0–46.0)
Hemoglobin: 10.9 g/dL — ABNORMAL LOW (ref 12.0–15.0)
MCH: 29.6 pg (ref 26.0–34.0)
MCHC: 33.1 g/dL (ref 30.0–36.0)
MCV: 89.4 fL (ref 80.0–100.0)
Platelets: 321 10*3/uL (ref 150–400)
RBC: 3.68 MIL/uL — ABNORMAL LOW (ref 3.87–5.11)
RDW: 14.3 % (ref 11.5–15.5)
WBC: 15.7 10*3/uL — ABNORMAL HIGH (ref 4.0–10.5)
nRBC: 0 % (ref 0.0–0.2)

## 2021-02-16 LAB — TYPE AND SCREEN
ABO/RH(D): B POS
Antibody Screen: NEGATIVE

## 2021-02-16 MED ORDER — LACTATED RINGERS IV SOLN: INTRAVENOUS | Status: DC

## 2021-02-16 MED ORDER — DIPHENHYDRAMINE HCL 50 MG/ML IJ SOLN: 12.5000 mg | INTRAMUSCULAR | Status: DC | PRN

## 2021-02-16 MED ORDER — OXYTOCIN-SODIUM CHLORIDE 30-0.9 UT/500ML-% IV SOLN: 2.5000 [IU]/h | INTRAVENOUS | Status: DC

## 2021-02-16 MED ORDER — OXYTOCIN BOLUS FROM INFUSION
333.0000 mL | Freq: Once | INTRAVENOUS | Status: DC
Start: 2021-02-16 — End: 2021-02-17

## 2021-02-16 MED ORDER — DIPHENHYDRAMINE HCL 50 MG/ML IJ SOLN
12.5000 mg | INTRAMUSCULAR | Status: DC | PRN
  Administered 2021-02-17: 12.5 mg via INTRAVENOUS
  Filled 2021-02-16: qty 1

## 2021-02-16 MED ORDER — ACETAMINOPHEN 325 MG PO TABS: 650.0000 mg | ORAL_TABLET | ORAL | Status: DC | PRN

## 2021-02-16 MED ORDER — LIDOCAINE HCL (PF) 1 % IJ SOLN
INTRAMUSCULAR | Status: DC | PRN
  Administered 2021-02-16: 8 mL via EPIDURAL

## 2021-02-16 MED ORDER — PHENYLEPHRINE 40 MCG/ML (10ML) SYRINGE FOR IV PUSH (FOR BLOOD PRESSURE SUPPORT): 80.0000 ug | PREFILLED_SYRINGE | INTRAVENOUS | Status: DC | PRN

## 2021-02-16 MED ORDER — EPHEDRINE 5 MG/ML INJ: 10.0000 mg | INTRAVENOUS | Status: DC | PRN

## 2021-02-16 MED ORDER — FENTANYL CITRATE (PF) 100 MCG/2ML IJ SOLN: 100.0000 ug | INTRAMUSCULAR | Status: DC | PRN

## 2021-02-16 MED ORDER — OXYCODONE-ACETAMINOPHEN 5-325 MG PO TABS: 1.0000 | ORAL_TABLET | ORAL | Status: DC | PRN

## 2021-02-16 MED ORDER — OXYCODONE-ACETAMINOPHEN 5-325 MG PO TABS
2.0000 | ORAL_TABLET | ORAL | Status: DC | PRN
Start: 2021-02-16 — End: 2021-02-17

## 2021-02-16 MED ORDER — FENTANYL CITRATE (PF) 100 MCG/2ML IJ SOLN
100.0000 ug | Freq: Once | INTRAMUSCULAR | Status: AC
  Administered 2021-02-16: 100 ug via INTRAVENOUS
  Filled 2021-02-16: qty 2

## 2021-02-16 MED ORDER — FENTANYL-BUPIVACAINE-NACL 0.5-0.125-0.9 MG/250ML-% EP SOLN: 12.0000 mL/h | EPIDURAL | Status: DC | PRN

## 2021-02-16 MED ORDER — LIDOCAINE HCL (PF) 1 % IJ SOLN: 30.0000 mL | INTRAMUSCULAR | Status: DC | PRN

## 2021-02-16 MED ORDER — LACTATED RINGERS IV SOLN
INTRAVENOUS | Status: DC
Start: 2021-02-16 — End: 2021-02-16

## 2021-02-16 MED ORDER — FENTANYL-BUPIVACAINE-NACL 0.5-0.125-0.9 MG/250ML-% EP SOLN
12.0000 mL/h | EPIDURAL | Status: DC | PRN
Start: 2021-02-16 — End: 2021-02-17
  Administered 2021-02-16: 12 mL/h via EPIDURAL
  Filled 2021-02-16: qty 250

## 2021-02-16 MED ORDER — SOD CITRATE-CITRIC ACID 500-334 MG/5ML PO SOLN
30.0000 mL | ORAL | Status: DC | PRN
  Filled 2021-02-16: qty 30

## 2021-02-16 MED ORDER — ONDANSETRON HCL 4 MG/2ML IJ SOLN: 4.0000 mg | Freq: Four times a day (QID) | INTRAMUSCULAR | Status: DC | PRN

## 2021-02-16 MED ORDER — LACTATED RINGERS IV SOLN
500.0000 mL | Freq: Once | INTRAVENOUS | Status: AC
  Administered 2021-02-16: 500 mL via INTRAVENOUS

## 2021-02-16 MED ORDER — OXYTOCIN-SODIUM CHLORIDE 30-0.9 UT/500ML-% IV SOLN
INTRAVENOUS | Status: AC
  Filled 2021-02-16: qty 500

## 2021-02-16 MED ORDER — LACTATED RINGERS IV SOLN: 500.0000 mL | INTRAVENOUS | Status: DC | PRN

## 2021-02-16 NOTE — MAU Note (Signed)
Contractions all day. Last hour have been 5 min apart.  No bleeding. No leaking. Baby moving well.

## 2021-02-16 NOTE — H&P (Signed)
Nicole Clay is a 30 y.o. female presenting for labor/contractions. Reports painful contractions every 5 mins. No leaking or bleeding. + fetal movement.   She has received regular prenatal care at [facility].   OB History     Gravida  1   Para      Term      Preterm      AB      Living         SAB      IAB      Ectopic      Multiple      Live Births             Past Medical History:  Diagnosis Date   Anemia    Past Surgical History:  Procedure Laterality Date   WISDOM TOOTH EXTRACTION  02/2020   Family History: family history includes Hypertension in her father. Social History:  reports that she has been smoking. She has never used smokeless tobacco. She reports that she does not drink alcohol and does not use drugs.     Maternal Diabetes: No Genetic Screening: Normal Maternal Ultrasounds/Referrals: Normal Fetal Ultrasounds or other Referrals:  Referred to Materal Fetal Medicine  Maternal Substance Abuse:  No Significant Maternal Medications:  None Significant Maternal Lab Results:  Group B Strep negative Other Comments:  None  Review of Systems Maternal Medical History:  Reason for admission: Contractions.   Contractions: Onset was less than 1 hour ago.   Frequency: regular.   Perceived severity is moderate.   Fetal activity: Perceived fetal activity is normal.   Last perceived fetal movement was within the past hour.   Prenatal complications: No bleeding, HIV, PIH, IUGR, pre-eclampsia, preterm labor or substance abuse.   Prenatal Complications - Diabetes: none.  Dilation: 4.5 Effacement (%): 100 Station: -2 Exam by:: K.Wilson,RN Blood pressure 126/74, pulse 72, temperature 98.3 F (36.8 C), temperature source Oral, resp. rate 16, height 5\' 3"  (1.6 m), weight 73 kg, last menstrual period 05/21/2020, SpO2 97 %. Maternal Exam:  Cervix: Cervix evaluated by digital exam.   Dilation: 4.5 Effacement (%): 100 Cervical Position: Middle Station:  -2 Exam by:: K.Wilson,RN  Physical Exam Vitals and nursing note reviewed.  Constitutional:      General: She is not in acute distress.    Appearance: Normal appearance. She is not ill-appearing, toxic-appearing or diaphoretic.  Musculoskeletal:        General: Normal range of motion.  Neurological:     Mental Status: She is alert and oriented to person, place, and time.  Psychiatric:        Mood and Affect: Mood normal.    Prenatal labs: ABO, Rh: B/Positive/-- (02/18 1111) Antibody: Negative (02/18 1111) Rubella: 2.10 (02/18 1111) RPR: Non Reactive (06/03 1003)  HBsAg: Negative (02/18 1111)  HIV: Non Reactive (06/03 1003)  GBS: Negative/-- (08/02 0351)   Assessment/Plan:  Active labor  GBS negative BP- WNL Category 1 fetal tracing.  Patient desires epidural   Nicole Clay, [id number], NP 02/16/2021 9:23 PM

## 2021-02-16 NOTE — Anesthesia Preprocedure Evaluation (Signed)
Anesthesia Evaluation  Patient identified by MRN, date of birth, ID band Patient awake    Reviewed: Allergy & Precautions, H&P , NPO status , Patient's Chart, lab work & pertinent test results, reviewed documented beta blocker date and time   Airway Mallampati: II  TM Distance: >3 FB Neck ROM: full    Dental no notable dental hx. (+) Teeth Intact, Dental Advisory Given   Pulmonary neg pulmonary ROS, Current Smoker,    Pulmonary exam normal breath sounds clear to auscultation       Cardiovascular negative cardio ROS Normal cardiovascular exam Rhythm:regular Rate:Normal     Neuro/Psych PSYCHIATRIC DISORDERS Anxiety Depression negative neurological ROS     GI/Hepatic negative GI ROS, Neg liver ROS,   Endo/Other  negative endocrine ROS  Renal/GU negative Renal ROS  negative genitourinary   Musculoskeletal   Abdominal   Peds  Hematology negative hematology ROS (+)   Anesthesia Other Findings   Reproductive/Obstetrics (+) Pregnancy                             Anesthesia Physical Anesthesia Plan  ASA: 3  Anesthesia Plan: Epidural   Post-op Pain Management:    Induction:   PONV Risk Score and Plan:   Airway Management Planned:   Additional Equipment: None  Intra-op Plan:   Post-operative Plan:   Informed Consent: I have reviewed the patients History and Physical, chart, labs and discussed the procedure including the risks, benefits and alternatives for the proposed anesthesia with the patient or authorized representative who has indicated his/her understanding and acceptance.       Plan Discussed with: Anesthesiologist  Anesthesia Plan Comments:         Anesthesia Quick Evaluation

## 2021-02-17 ENCOUNTER — Encounter (HOSPITAL_COMMUNITY)
Admission: AD | Disposition: A | Discharge status: Home / Self Care | Payer: Self-pay | Source: Home / Self Care | Attending: Obstetrics and Gynecology

## 2021-02-17 ENCOUNTER — Encounter (HOSPITAL_COMMUNITY): Payer: Self-pay | Admitting: Obstetrics and Gynecology

## 2021-02-17 DIAGNOSIS — O34211 Maternal care for low transverse scar from previous cesarean delivery: Secondary | ICD-10-CM

## 2021-02-17 DIAGNOSIS — Z3A39 39 weeks gestation of pregnancy: Secondary | ICD-10-CM

## 2021-02-17 LAB — RPR: RPR Ser Ql: NONREACTIVE

## 2021-02-17 LAB — CBC
HCT: 29.5 % — ABNORMAL LOW (ref 36.0–46.0)
Hemoglobin: 9.7 g/dL — ABNORMAL LOW (ref 12.0–15.0)
MCH: 29.7 pg (ref 26.0–34.0)
MCHC: 32.9 g/dL (ref 30.0–36.0)
MCV: 90.2 fL (ref 80.0–100.0)
Platelets: 259 10*3/uL (ref 150–400)
RBC: 3.27 MIL/uL — ABNORMAL LOW (ref 3.87–5.11)
RDW: 14.5 % (ref 11.5–15.5)
WBC: 19.8 10*3/uL — ABNORMAL HIGH (ref 4.0–10.5)
nRBC: 0 % (ref 0.0–0.2)

## 2021-02-17 LAB — RESP PANEL BY RT-PCR (FLU A&B, COVID) ARPGX2
Influenza A by PCR: NEGATIVE
Influenza B by PCR: NEGATIVE
SARS Coronavirus 2 by RT PCR: NEGATIVE

## 2021-02-17 SURGERY — Surgical Case
Anesthesia: Epidural

## 2021-02-17 MED ORDER — MORPHINE SULFATE (PF) 0.5 MG/ML IJ SOLN
INTRAMUSCULAR | Status: AC
  Filled 2021-02-17: qty 10

## 2021-02-17 MED ORDER — DIBUCAINE (PERIANAL) 1 % EX OINT: 1.0000 "application " | TOPICAL_OINTMENT | CUTANEOUS | Status: DC | PRN

## 2021-02-17 MED ORDER — KETOROLAC TROMETHAMINE 30 MG/ML IJ SOLN
30.0000 mg | Freq: Four times a day (QID) | INTRAMUSCULAR | Status: AC | PRN
  Administered 2021-02-17 (×3): 30 mg via INTRAVENOUS
  Filled 2021-02-17 (×2): qty 1

## 2021-02-17 MED ORDER — DIPHENHYDRAMINE HCL 25 MG PO CAPS
25.0000 mg | ORAL_CAPSULE | Freq: Four times a day (QID) | ORAL | Status: DC | PRN
  Administered 2021-02-17 – 2021-02-18 (×2): 25 mg via ORAL
  Filled 2021-02-17: qty 1

## 2021-02-17 MED ORDER — TERBUTALINE SULFATE 1 MG/ML IJ SOLN: 0.2500 mg | Freq: Once | INTRAMUSCULAR | Status: AC

## 2021-02-17 MED ORDER — ONDANSETRON HCL 4 MG/2ML IJ SOLN
INTRAMUSCULAR | Status: AC
  Filled 2021-02-17: qty 2

## 2021-02-17 MED ORDER — NALBUPHINE HCL 10 MG/ML IJ SOLN: 5.0000 mg | Freq: Once | INTRAMUSCULAR | Status: DC | PRN

## 2021-02-17 MED ORDER — MEPERIDINE HCL 25 MG/ML IJ SOLN: 6.2500 mg | INTRAMUSCULAR | Status: DC | PRN

## 2021-02-17 MED ORDER — OXYTOCIN-SODIUM CHLORIDE 30-0.9 UT/500ML-% IV SOLN
INTRAVENOUS | Status: AC
  Filled 2021-02-17: qty 500

## 2021-02-17 MED ORDER — KETOROLAC TROMETHAMINE 30 MG/ML IJ SOLN
INTRAMUSCULAR | Status: AC
  Filled 2021-02-17: qty 1

## 2021-02-17 MED ORDER — ACETAMINOPHEN 325 MG PO TABS
650.0000 mg | ORAL_TABLET | ORAL | Status: DC | PRN
  Administered 2021-02-18 – 2021-02-19 (×3): 650 mg via ORAL
  Filled 2021-02-17 (×3): qty 2

## 2021-02-17 MED ORDER — OXYCODONE HCL 5 MG PO TABS: 5.0000 mg | ORAL_TABLET | ORAL | Status: DC | PRN

## 2021-02-17 MED ORDER — MENTHOL 3 MG MT LOZG: 1.0000 | LOZENGE | OROMUCOSAL | Status: DC | PRN

## 2021-02-17 MED ORDER — FENTANYL CITRATE (PF) 100 MCG/2ML IJ SOLN: 25.0000 ug | INTRAMUSCULAR | Status: DC | PRN

## 2021-02-17 MED ORDER — ENOXAPARIN SODIUM 40 MG/0.4ML IJ SOSY
40.0000 mg | PREFILLED_SYRINGE | INTRAMUSCULAR | Status: DC
  Administered 2021-02-17 – 2021-02-18 (×2): 40 mg via SUBCUTANEOUS
  Filled 2021-02-17 (×2): qty 0.4

## 2021-02-17 MED ORDER — SODIUM CHLORIDE 0.9% FLUSH: 3.0000 mL | INTRAVENOUS | Status: DC | PRN

## 2021-02-17 MED ORDER — OXYTOCIN-SODIUM CHLORIDE 30-0.9 UT/500ML-% IV SOLN: 2.5000 [IU]/h | INTRAVENOUS | Status: AC

## 2021-02-17 MED ORDER — KETOROLAC TROMETHAMINE 30 MG/ML IJ SOLN
30.0000 mg | Freq: Four times a day (QID) | INTRAMUSCULAR | Status: AC | PRN
Start: 2021-02-17 — End: 2021-02-18

## 2021-02-17 MED ORDER — ONDANSETRON HCL 4 MG/2ML IJ SOLN: 4.0000 mg | Freq: Three times a day (TID) | INTRAMUSCULAR | Status: DC | PRN

## 2021-02-17 MED ORDER — COCONUT OIL OIL
1.0000 "application " | TOPICAL_OIL | Status: DC | PRN
  Administered 2021-02-18: 1 via TOPICAL

## 2021-02-17 MED ORDER — NALOXONE HCL 4 MG/10ML IJ SOLN
1.0000 ug/kg/h | INTRAVENOUS | Status: DC | PRN
  Filled 2021-02-17: qty 5

## 2021-02-17 MED ORDER — NALBUPHINE HCL 10 MG/ML IJ SOLN: 5.0000 mg | INTRAMUSCULAR | Status: DC | PRN

## 2021-02-17 MED ORDER — MEPERIDINE HCL 25 MG/ML IJ SOLN
INTRAMUSCULAR | Status: AC
  Filled 2021-02-17: qty 1

## 2021-02-17 MED ORDER — DIPHENHYDRAMINE HCL 25 MG PO CAPS
25.0000 mg | ORAL_CAPSULE | ORAL | Status: DC | PRN
  Filled 2021-02-17: qty 1

## 2021-02-17 MED ORDER — SIMETHICONE 80 MG PO CHEW
80.0000 mg | CHEWABLE_TABLET | Freq: Three times a day (TID) | ORAL | Status: DC
  Administered 2021-02-17 – 2021-02-19 (×6): 80 mg via ORAL
  Filled 2021-02-17 (×6): qty 1

## 2021-02-17 MED ORDER — CEFAZOLIN SODIUM-DEXTROSE 2-4 GM/100ML-% IV SOLN
2.0000 g | INTRAVENOUS | Status: AC
  Administered 2021-02-17: 2 g via INTRAVENOUS

## 2021-02-17 MED ORDER — SOD CITRATE-CITRIC ACID 500-334 MG/5ML PO SOLN
30.0000 mL | ORAL | Status: AC
  Administered 2021-02-17: 30 mL via ORAL

## 2021-02-17 MED ORDER — TERBUTALINE SULFATE 1 MG/ML IJ SOLN
INTRAMUSCULAR | Status: AC
  Administered 2021-02-17: 0.25 mg via SUBCUTANEOUS
  Filled 2021-02-17: qty 1

## 2021-02-17 MED ORDER — SODIUM CHLORIDE 0.9 % IV SOLN
500.0000 mg | INTRAVENOUS | Status: AC
  Administered 2021-02-17: 500 mg via INTRAVENOUS

## 2021-02-17 MED ORDER — ACETAMINOPHEN 325 MG PO TABS: 325.0000 mg | ORAL_TABLET | ORAL | Status: DC | PRN

## 2021-02-17 MED ORDER — IBUPROFEN 600 MG PO TABS
600.0000 mg | ORAL_TABLET | Freq: Four times a day (QID) | ORAL | Status: DC
  Administered 2021-02-17 – 2021-02-19 (×7): 600 mg via ORAL
  Filled 2021-02-17 (×7): qty 1

## 2021-02-17 MED ORDER — LIDOCAINE-EPINEPHRINE (PF) 2 %-1:200000 IJ SOLN
INTRAMUSCULAR | Status: DC | PRN
  Administered 2021-02-17 (×2): 5 mL via EPIDURAL
  Administered 2021-02-17: 3 mL via EPIDURAL

## 2021-02-17 MED ORDER — DEXAMETHASONE SODIUM PHOSPHATE 4 MG/ML IJ SOLN
INTRAMUSCULAR | Status: AC
  Filled 2021-02-17: qty 1

## 2021-02-17 MED ORDER — CEFAZOLIN SODIUM-DEXTROSE 2-4 GM/100ML-% IV SOLN
INTRAVENOUS | Status: AC
  Filled 2021-02-17: qty 100

## 2021-02-17 MED ORDER — PHENYLEPHRINE HCL (PRESSORS) 10 MG/ML IV SOLN
INTRAVENOUS | Status: DC | PRN
  Administered 2021-02-17 (×5): 80 ug via INTRAVENOUS

## 2021-02-17 MED ORDER — MEASLES, MUMPS & RUBELLA VAC IJ SOLR: 0.5000 mL | Freq: Once | INTRAMUSCULAR | Status: DC

## 2021-02-17 MED ORDER — WITCH HAZEL-GLYCERIN EX PADS: 1.0000 "application " | MEDICATED_PAD | CUTANEOUS | Status: DC | PRN

## 2021-02-17 MED ORDER — OXYCODONE HCL 5 MG PO TABS
5.0000 mg | ORAL_TABLET | Freq: Once | ORAL | Status: DC | PRN
Start: 2021-02-17 — End: 2021-02-17

## 2021-02-17 MED ORDER — SCOPOLAMINE 1 MG/3DAYS TD PT72
MEDICATED_PATCH | TRANSDERMAL | Status: AC
  Filled 2021-02-17: qty 1

## 2021-02-17 MED ORDER — DEXAMETHASONE SODIUM PHOSPHATE 4 MG/ML IJ SOLN
INTRAMUSCULAR | Status: DC | PRN
  Administered 2021-02-17: 4 mg via INTRAVENOUS

## 2021-02-17 MED ORDER — LACTATED RINGERS IV BOLUS
1000.0000 mL | Freq: Once | INTRAVENOUS | Status: AC
  Administered 2021-02-17: 1000 mL via INTRAVENOUS

## 2021-02-17 MED ORDER — LACTATED RINGERS IV SOLN: INTRAVENOUS | Status: DC | PRN

## 2021-02-17 MED ORDER — PRENATAL MULTIVITAMIN CH
1.0000 | ORAL_TABLET | Freq: Every day | ORAL | Status: DC
  Administered 2021-02-17 – 2021-02-19 (×3): 1 via ORAL
  Filled 2021-02-17 (×3): qty 1

## 2021-02-17 MED ORDER — PHENYLEPHRINE 40 MCG/ML (10ML) SYRINGE FOR IV PUSH (FOR BLOOD PRESSURE SUPPORT)
PREFILLED_SYRINGE | INTRAVENOUS | Status: AC
  Filled 2021-02-17: qty 10

## 2021-02-17 MED ORDER — SCOPOLAMINE 1 MG/3DAYS TD PT72
1.0000 | MEDICATED_PATCH | Freq: Once | TRANSDERMAL | Status: DC
  Administered 2021-02-17: 1.5 mg via TRANSDERMAL

## 2021-02-17 MED ORDER — ACETAMINOPHEN 160 MG/5ML PO SOLN: 325.0000 mg | ORAL | Status: DC | PRN

## 2021-02-17 MED ORDER — NALOXONE HCL 0.4 MG/ML IJ SOLN: 0.4000 mg | INTRAMUSCULAR | Status: DC | PRN

## 2021-02-17 MED ORDER — MORPHINE SULFATE (PF) 0.5 MG/ML IJ SOLN
INTRAMUSCULAR | Status: DC | PRN
  Administered 2021-02-17: 3 mg via EPIDURAL

## 2021-02-17 MED ORDER — SIMETHICONE 80 MG PO CHEW
80.0000 mg | CHEWABLE_TABLET | ORAL | Status: DC | PRN
  Administered 2021-02-19: 80 mg via ORAL
  Filled 2021-02-17: qty 1

## 2021-02-17 MED ORDER — ONDANSETRON HCL 4 MG/2ML IJ SOLN: 4.0000 mg | Freq: Once | INTRAMUSCULAR | Status: DC | PRN

## 2021-02-17 MED ORDER — TETANUS-DIPHTH-ACELL PERTUSSIS 5-2.5-18.5 LF-MCG/0.5 IM SUSY: 0.5000 mL | PREFILLED_SYRINGE | Freq: Once | INTRAMUSCULAR | Status: DC

## 2021-02-17 MED ORDER — SODIUM CHLORIDE 0.9 % IR SOLN
Status: DC | PRN
  Administered 2021-02-17: 1

## 2021-02-17 MED ORDER — SENNOSIDES-DOCUSATE SODIUM 8.6-50 MG PO TABS
2.0000 | ORAL_TABLET | Freq: Every day | ORAL | Status: DC
  Administered 2021-02-18 – 2021-02-19 (×2): 2 via ORAL
  Filled 2021-02-17 (×2): qty 2

## 2021-02-17 MED ORDER — ONDANSETRON HCL 4 MG/2ML IJ SOLN
INTRAMUSCULAR | Status: DC | PRN
  Administered 2021-02-17: 4 mg via INTRAVENOUS

## 2021-02-17 MED ORDER — DIPHENHYDRAMINE HCL 50 MG/ML IJ SOLN: 12.5000 mg | INTRAMUSCULAR | Status: DC | PRN

## 2021-02-17 MED ORDER — OXYTOCIN-SODIUM CHLORIDE 30-0.9 UT/500ML-% IV SOLN
INTRAVENOUS | Status: DC | PRN
  Administered 2021-02-17: 350 mL via INTRAVENOUS

## 2021-02-17 MED ORDER — OXYCODONE HCL 5 MG/5ML PO SOLN: 5.0000 mg | Freq: Once | ORAL | Status: DC | PRN

## 2021-02-17 MED ORDER — MEPERIDINE HCL 25 MG/ML IJ SOLN
INTRAMUSCULAR | Status: DC | PRN
  Administered 2021-02-17 (×2): 12.5 mg via INTRAVENOUS

## 2021-02-17 SURGICAL SUPPLY — 29 items
BENZOIN TINCTURE PRP APPL 2/3 (GAUZE/BANDAGES/DRESSINGS) ×2 IMPLANT
CLOSURE STERI STRIP 1/2 X4 (GAUZE/BANDAGES/DRESSINGS) ×2 IMPLANT
CLOTH BEACON ORANGE TIMEOUT ST (SAFETY) ×2 IMPLANT
DRSG OPSITE POSTOP 4X10 (GAUZE/BANDAGES/DRESSINGS) ×2 IMPLANT
ELECT REM PT RETURN 9FT ADLT (ELECTROSURGICAL) ×2
ELECTRODE REM PT RTRN 9FT ADLT (ELECTROSURGICAL) ×1 IMPLANT
EXTRACTOR VACUUM KIWI (MISCELLANEOUS) IMPLANT
GLOVE BIOGEL PI IND STRL 7.0 (GLOVE) ×1 IMPLANT
GLOVE BIOGEL PI INDICATOR 7.0 (GLOVE) ×1
GLOVE SURG ORTHO 8.0 STRL STRW (GLOVE) ×2 IMPLANT
GOWN STRL REUS W/TWL LRG LVL3 (GOWN DISPOSABLE) ×4 IMPLANT
KIT ABG SYR 3ML LUER SLIP (SYRINGE) IMPLANT
NEEDLE HYPO 25X5/8 SAFETYGLIDE (NEEDLE) IMPLANT
NS IRRIG 1000ML POUR BTL (IV SOLUTION) ×2 IMPLANT
PACK C SECTION WH (CUSTOM PROCEDURE TRAY) ×2 IMPLANT
PAD OB MATERNITY 4.3X12.25 (PERSONAL CARE ITEMS) ×2 IMPLANT
PENCIL SMOKE EVAC W/HOLSTER (ELECTROSURGICAL) ×2 IMPLANT
RTRCTR C-SECT PINK 25CM LRG (MISCELLANEOUS) IMPLANT
STRIP CLOSURE SKIN 1/2X4 (GAUZE/BANDAGES/DRESSINGS) IMPLANT
SUT MON AB-0 CT1 36 (SUTURE) ×4 IMPLANT
SUT PLAIN 0 NONE (SUTURE) IMPLANT
SUT VIC AB 0 CT1 27 (SUTURE) ×2
SUT VIC AB 0 CT1 27XBRD ANBCTR (SUTURE) ×2 IMPLANT
SUT VIC AB 2-0 CT1 27 (SUTURE) ×1
SUT VIC AB 2-0 CT1 TAPERPNT 27 (SUTURE) ×1 IMPLANT
SUT VIC AB 4-0 KS 27 (SUTURE) ×2 IMPLANT
TOWEL OR 17X24 6PK STRL BLUE (TOWEL DISPOSABLE) ×2 IMPLANT
TRAY FOLEY W/BAG SLVR 14FR LF (SET/KITS/TRAYS/PACK) ×2 IMPLANT
WATER STERILE IRR 1000ML POUR (IV SOLUTION) ×4 IMPLANT

## 2021-02-17 NOTE — Progress Notes (Signed)
Called to patient room due to intermittent fetal bradycardia.  Pt was in hands and knee position.  Cervical exam was performed by CNM.  During exam fetal tailbone was detected, and Breech presentation was confirmed with bedside ultrasound.  Pitocin was off and terbutaline was given.  Due to the recurrent bradycardia and untested pelvis, decision was made for primary cesarean section.    The risks of surgery were discussed with the patient including but were not limited to: bleeding which may require transfusion or reoperation; infection which may require antibiotics; injury to bowel, bladder, ureters or other surrounding organs; injury to the fetus; need for additional procedures including hysterectomy in the event of a life-threatening hemorrhage; formation of adhesions; placental abnormalities wth subsequent pregnancies; incisional problems; thromboembolic phenomenon and other postoperative/anesthesia complications.  The patient concurred with the proposed plan, giving informed written consent for the procedure.   Patient has been NPO since admission she will remain NPO for procedure. Anesthesia and OR aware. Preoperative prophylactic antibiotics and SCDs ordered on call to the OR.  To OR when ready.  Mariel Aloe, MD

## 2021-02-17 NOTE — Op Note (Addendum)
[name] PROCEDURE DATE: 02/17/2021  PREOPERATIVE DIAGNOSES: Intrauterine pregnancy at [redacted]w[redacted]d weeks gestation; malpresentation: frank breech  POSTOPERATIVE DIAGNOSES: The same  PROCEDURE: Primary Repeat Low Transverse Cesarean Section  SURGEON:  Dr. Mariel Aloe  ASSISTANT:  Warner Mccreedy, MD  ANESTHESIOLOGY TEAM: Anesthesiologist: Bethena Midget, MD CRNA: Trellis Paganini, CRNA  INDICATIONS: Nicole Clay is a 30 y.o. G1P1001 at [redacted]w[redacted]d here for cesarean section secondary to the indications listed under preoperative diagnoses; please see preoperative note for further details.  The risks of surgery were discussed with the patient including but were not limited to: bleeding which may require transfusion or reoperation; infection which may require antibiotics; injury to bowel, bladder, ureters or other surrounding organs; injury to the fetus; need for additional procedures including hysterectomy in the event of a life-threatening hemorrhage; formation of adhesions; placental abnormalities wth subsequent pregnancies; incisional problems; thromboembolic phenomenon and other postoperative/anesthesia complications.  The patient concurred with the proposed plan, giving informed written consent for the procedure.    FINDINGS:  Viable female infant in breech presentation.  Apgars 5 and 9.  Clear amniotic fluid.  Intact placenta, three vessel cord.  Normal uterus, fallopian tubes and ovaries bilaterally.  ANESTHESIA: Epidural  INTRAVENOUS FLUIDS: 2800 ml   ESTIMATED BLOOD LOSS: 216 ml URINE OUTPUT:  150 ml SPECIMENS: Placenta sent to L&D  COMPLICATIONS: None immediate  PROCEDURE IN DETAIL:  The patient preoperatively received intravenous antibiotics and had sequential compression devices applied to her lower extremities.  She was then taken to the operating room where spinal anesthesia was administered in sterile fashion the epidural anesthesia was dosed up to surgical level and was found to be adequate.  She was then placed in a dorsal supine position with a leftward tilt, and prepped and draped in a sterile manner.  A foley catheter was placed into her bladder and attached to constant gravity.  After an adequate timeout was performed, a Pfannenstiel skin incision was made with scalpel two fingerbreaths above the pubic symphasis and carried through to the underlying layer of fascia. The fascia was incised in the midline, and this incision was extended bilaterally using the Mayo scissors.  Kocher clamps x 2 were applied to the superior aspect of the fascial incision and the underlying rectus muscles were dissected off bluntly and sharply.  A similar process was carried out on the inferior aspect of the fascial incision. The rectus muscles were separated in the midline and the peritoneum was entered bluntly. The Alexis self-retaining retractor was introduced into the abdominal cavity.  The vesicouterine peritoneum was identified and grasped using smooth pickups.  It was incised and extended laterally using the Metzenbaum scissors.  A bladder flap was then digitally created.  Attention was turned to the lower uterine segment where a low transverse hysterotomy was made with a scalpel and extended bilaterally bluntly.  The infant was successfully delivered, the cord was clamped and cut immediately, and the infant was handed over to the awaiting neonatology team. Cord gas and cord blood was then collected. Uterine massage was then administered, and the placenta delivered intact with a three-vessel cord. The uterus was then cleared of clots and debris using manual curettage.  The uterine incision was closed with 1-0 chromic in a running locked fashion, and an imbricating layer was also placed with 1-0 chromic.  The pelvis was cleared of all clot and debris with irrigation and suction. Hemostasis was confirmed on all surfaces.  The retractor was removed.  The peritoneum was closed with a  2-0 Vicryl running stitch. The  fascia was then closed using 0 Vicryl in a running fashion.  The subcutaneous layer was irrigated and the skin was closed with a 4-0 Vicryl subcuticular stitch. The patient tolerated the procedure well. Sponge, instrument and needle counts were correct x 3.  She was taken to the recovery room in stable condition.   Warner Mccreedy, MD, MPH OB Fellow, Faculty Practice Center for [facility] Attending Supervision of Obstetric Fellow: Evaluation and management procedures were performed by the Obstetric Fellow under my supervision and collaboration.  I have reviewed the Obstetric Fellow's note and chart, and I agree with the management and plan. I have also made any necessary editorial changes.   Warden Fillers, MD Attending Obstetrician & Gynecologist, [facility] for [facility], [facility] 02/18/2021 11:37 PM

## 2021-02-17 NOTE — Progress Notes (Signed)
CSW received and acknowledges consult for EDPS of 9.  Consult screened out due to 9 on EDPS does not warrant a CSW consult.  MOB whom scores are greater than 9/yes to question 10 on Edinburgh Postpartum Depression Screen warrants a CSW consult.   MOB was referred for history of depression/anxiety. * Referral screened out by Clinical Social Worker because none of the following criteria appear to apply: ~ History of anxiety/depression during this pregnancy, or of post-partum depression. ~ Diagnosis of anxiety and/or depression within last 3 years. No concerns noted in OB records.  OR * MOB's symptoms currently being treated with medication and/or therapy. Please contact the Clinical Social Worker if needs arise, or if MOB requests.   Blaine Hamper, MSW, LCSW Clinical Social Work [phone]

## 2021-02-17 NOTE — Anesthesia Postprocedure Evaluation (Signed)
Anesthesia Post Note  Patient: Nicole Clay  Procedure(s) Performed: CESAREAN SECTION     Patient location during evaluation: Mother Baby Anesthesia Type: Epidural Level of consciousness: awake and alert Pain management: pain level controlled Vital Signs Assessment: post-procedure vital signs reviewed and stable Respiratory status: spontaneous breathing, nonlabored ventilation and respiratory function stable Cardiovascular status: stable Postop Assessment: no headache, no backache and epidural receding Anesthetic complications: no   No notable events documented.  Last Vitals:  Vitals:   02/17/21 0350 02/17/21 0450  BP: (!) 109/56 114/74  Pulse: 86 64  Resp: 18 18  Temp: 36.4 C 36.6 C  SpO2: 96% 97%    Last Pain:  Vitals:   02/17/21 0615  TempSrc:   PainSc: 0-No pain   Pain Goal: Patients Stated Pain Goal: 0 (02/16/21 2001)                 Shiya Fogelman

## 2021-02-17 NOTE — Lactation Note (Signed)
This note was copied from a baby's chart. Lactation Consultation Note  Patient Name: Nicole Clay [id number] Date: 02/17/2021 Reason for consult: Initial assessment;Term;Primapara;1st time breastfeeding Age:29 hours   [name] whose infant is now 110 hours old.  This is a term baby at 39+6 weeks.   Baby was swaddled and asleep in the bassinet when I arrived.  Mother reported that he recently breast fed for 10 minutes although she didn't know if he was "getting anything."  Reviewed breast feeding basics with parents.  Mother questioning whether she should begin pumping.  Suggested she focus on hand expression and latching at this time; there is no need to begin pumping with the DEBP.  Reassurance provided. Encouraged mother to call her RN/LC for latch assistance as needed.    Mom made aware of O/P services, breastfeeding support groups, community resources, and our phone # for post-discharge questions.  Mother has a DEBP for home use.  Father present.   Maternal Data Has patient been taught Hand Expression?: Yes Does the patient have breastfeeding experience prior to this delivery?: No  Feeding Mother's Current Feeding Choice: Breast Milk  LATCH Score                    Lactation Tools Discussed/Used    Interventions Interventions: Education  Discharge Pump: Personal WIC Program: No  Consult Status Consult Status: Follow-up Date: 02/18/21 Follow-up type: In-patient    [name] 02/17/2021, 8:11 AM

## 2021-02-17 NOTE — Discharge Summary (Signed)
Postpartum Discharge Summary    Patient Name: Nicole Clay DOB: 21-Jan-1991 MRN: [id number]  Date of admission: 02/16/2021 Delivery date:02/17/2021  Delivering provider: Griffin Basil  Date of discharge: 02/19/2021  Admitting diagnosis: Indication for care in labor and delivery, antepartum [O75.9] Intrauterine pregnancy: [redacted]w[redacted]d    Secondary diagnosis:  Active Problems:   Supervision of normal first pregnancy, antepartum   Anxiety   Depression   Indication for care in labor and delivery, antepartum   Cesarean delivery delivered  Additional problems: None    Discharge diagnosis: Term Pregnancy Delivered                                              Post partum procedures: None Augmentation:  None Complications: None  Hospital course: Onset of Labor With Unplanned C/S   30y.o. yo G1P1001 at 354w6das admitted in Active Labor on 02/16/2021. Patient had a labor course significant for initial cephalic presentation and then at 9.5 cm found to be in frank breech presentation. The patient went for cesarean section due to Malpresentation. Delivery details as follows: Membrane Rupture Time/Date: 9:00 PM ,02/16/2021   Delivery Method:C-Section, Low Transverse  Details of operation can be found in separate operative note. Patient had an uncomplicated postpartum course.  She is ambulating,tolerating a regular diet, passing flatus, and urinating well.  Patient is discharged home in stable condition 02/19/21.  Newborn Data: Birth date:02/17/2021  Birth time:1:41 AM  Gender:Female  Living status:Living  Apgars:5 ,9  Weight:2824 g   Magnesium Sulfate received: No BMZ received: No Rhophylac:N/A MMR:N/A T-DaP:Given prenatally Flu: N/A Transfusion:No  Physical exam  Vitals:   02/18/21 0633 02/18/21 1500 02/18/21 2051 02/19/21 0539  BP: 102/64 (!) 96/58 110/69 117/74  Pulse: 68 64 79 67  Resp: _0 Temp: 98.1 F (36.7 C) 97.7 F (36.5 C) 97.9 F (36.6 C) 98.1 F (36.7 C)   TempSrc: Oral Oral Oral Oral  SpO2: 95%  98% 98%  Weight:      Height:       General: alert, cooperative, and no distress Lochia: appropriate Uterine Fundus: firm Incision: Healing well with no significant drainage, No significant erythema, Dressing is clean, dry, and intact DVT Evaluation: No evidence of DVT seen on physical exam. Labs: Lab Results  Component Value Date   WBC 19.8 (H) 02/17/2021   HGB 9.7 (L) 02/17/2021   HCT 29.5 (L) 02/17/2021   MCV 90.2 02/17/2021   PLT 259 02/17/2021   No flowsheet data found. Edinburgh Score: Edinburgh Postnatal Depression Scale Screening Tool 02/17/2021  I have been able to laugh and see the funny side of things. 0  I have looked forward with enjoyment to things. 0  I have blamed myself unnecessarily when things went wrong. 2  I have been anxious or worried for no good reason. 2  I have felt scared or panicky for no good reason. 2  Things have been getting on top of me. 1  I have been so unhappy that I have had difficulty sleeping. 0  I have felt sad or miserable. 2  I have been so unhappy that I have been crying. 0  The thought of harming myself has occurred to me. 0  Edinburgh Postnatal Depression Scale Total 9     After visit meds:  Allergies as of 02/19/2021  Reactions   Other Swelling, Rash   Tree nuts and walnuts        Medication List     TAKE these medications    acetaminophen 325 MG tablet Commonly known as: TYLENOL Take 650 mg by mouth every 6 (six) hours as needed for mild pain, fever or headache.   coconut oil Oil Apply 1 application topically as needed.   ibuprofen 600 MG tablet Commonly known as: ADVIL Take 1 tablet (600 mg total) by mouth every 6 (six) hours.   norethindrone 0.35 MG tablet Commonly known as: Ortho Micronor Take 1 tablet (0.35 mg total) by mouth daily.   oxyCODONE 5 MG immediate release tablet Commonly known as: Oxy IR/ROXICODONE Take 1 tablet (5 mg total) by mouth every 4  (four) hours as needed for moderate pain.   polyethylene glycol 17 g packet Commonly known as: MiraLax Take 17 g by mouth daily.   simethicone 80 MG chewable tablet Commonly known as: MYLICON Chew 1 tablet (80 mg total) by mouth 3 (three) times daily after meals.         Discharge home in stable condition Infant Feeding: Breast Infant Disposition:home with mother Discharge instruction: per After Visit Summary and Postpartum booklet. Activity: Advance as tolerated. Pelvic rest for 6 weeks.  Diet: routine diet Future Appointments: Future Appointments  Date Time Provider [place]  02/25/2021 10:40 AM [place] None  03/25/2021  8:15 AM Genia Del, MD [facility] None   Follow up Visit: Message sent to [facility] by Dr. Cy Blamer on 8/21  Please schedule this patient for a In person postpartum visit in 4 weeks with the following provider: Any provider. Additional Postpartum F/U:Incision check 1 week  Low risk pregnancy complicated by:  breech presentation Delivery mode:  C-Section, Low Transverse  Anticipated Birth Control:  POPs, Desires IUD   02/19/2021 Delora Fuel, MD

## 2021-02-17 NOTE — Anesthesia Procedure Notes (Signed)
Epidural Patient location during procedure: OB Start time: 02/16/2021 11:17 PM End time: 02/16/2021 11:20 PM  Staffing Anesthesiologist: Bethena Midget, MD  Preanesthetic Checklist Completed: patient identified, IV checked, site marked, risks and benefits discussed, surgical consent, monitors and equipment checked, pre-op evaluation and timeout performed  Epidural Patient position: sitting Prep: DuraPrep and site prepped and draped Patient monitoring: continuous pulse ox and blood pressure Approach: midline Location: L3-L4 Injection technique: LOR air  Needle:  Needle type: Tuohy  Needle gauge: 17 G Needle length: 9 cm and 9 Needle insertion depth: 5 cm Catheter type: closed end flexible Catheter size: 19 Gauge Catheter at skin depth: 10 cm Test dose: negative  Assessment Events: blood not aspirated, injection not painful, no injection resistance, no paresthesia and negative IV test

## 2021-02-17 NOTE — Progress Notes (Signed)
Carlo Lorson is a 30 y.o. G1P0 at [redacted]w[redacted]d   Subjective: Epiduralized, aware of contractions but able to reposition with RN assist x 1  Objective: BP (!) 118/94   Pulse 80   Temp 98.3 F (36.8 C) (Oral)   Resp 18   Ht 5\' 3"  (1.6 m)   Wt 73 kg   LMP 05/21/2020   SpO2 98%   BMI 28.52 kg/m  No intake/output data recorded. No intake/output data recorded.  FHT:  Baseline 135, 15 x 15 accels with scalp stim, recurrent variable decels, intermittent fetal bradycardia UC:   regular, every 2-3 minutes SVE:   Dilation: Lip/rim Effacement (%): 100 Station: -1 Exam by:: [email or web address] CNM  Labs: Lab Results  Component Value Date   WBC 15.7 (H) 02/16/2021   HGB 10.9 (L) 02/16/2021   HCT 32.9 (L) 02/16/2021   MCV 89.4 02/16/2021   PLT 321 02/16/2021    Assessment / Plan: --30 y.o. G1P0 at [redacted]w[redacted]d  --SROM 02/16/2021 at 2115 with subsequent SOL --Previously confirmed cephalic in MAU at 2115 --Now breech per my exam --Cervix 9.5/100/0 to +1 per my exam --Thick residual cervical tissue per my exam, not improved with IV Benadryl given at 0008 --Thick meconium --Dr. 01-28-1968 at bedside for assessment of labor status --Per Dr. Donavan Foil, prep for OR  Donavan Foil, CNM 02/17/2021, 12:50 AM

## 2021-02-17 NOTE — Transfer of Care (Signed)
Immediate Anesthesia Transfer of Care Note  Patient: Nicole Clay  Procedure(s) Performed: CESAREAN SECTION  Patient Location: PACU  Anesthesia Type:Epidural  Level of Consciousness: awake, alert  and patient cooperative  Airway & Oxygen Therapy: Patient Spontanous Breathing  Post-op Assessment: Report given to RN and Post -op Vital signs reviewed and stable  Post vital signs: Reviewed and stable  Last Vitals:  Vitals Value Taken Time  BP 114/73 02/17/21 0235  Temp    Pulse 107 02/17/21 0236  Resp 24 02/17/21 0236  SpO2 98 % 02/17/21 0236  Vitals shown include unvalidated device data.  Last Pain:  Vitals:   02/16/21 2225  TempSrc:   PainSc: 8       Patients Stated Pain Goal: 0 (02/16/21 2001)  Complications: No notable events documented.

## 2021-02-18 NOTE — Lactation Note (Signed)
This note was copied from a baby's chart. Lactation Consultation Note  Patient Name: Nicole Clay [id number] Date: 02/18/2021 Reason for consult: Follow-up assessment;Term;Primapara;1st time breastfeeding Age:29 hours   [name] whose infant is now 6 hours old.  This is a term baby at 39+6 weeks.  Mother was breast feeding when I arrived.  Baby was swaddled and actively sucking.  Mother denied pain with latching.  Educated mother on STS feeding at the breast and the benefits of feeding STS.  She feels comfortable with latching and feels like baby is feeding well; approximately every two hours last night.  Observed him feeding and praised mother for her efforts with latching.  Encouraged to continue feeding on cue and to call her RN/LC for any questions/concerns or latching assistance.  Mother verbalized understanding.  Father present.  Mother has a DEBP for home use.   Maternal Data Has patient been taught Hand Expression?: Yes Does the patient have breastfeeding experience prior to this delivery?: No  Feeding Mother's Current Feeding Choice: Breast Milk  LATCH Score Latch: Grasps breast easily, tongue down, lips flanged, rhythmical sucking.  Audible Swallowing: A few with stimulation  Type of Nipple: Everted at rest and after stimulation (Per mother; baby latched when I arrived so I did not observe)  Comfort (Breast/Nipple): Soft / non-tender  Hold (Positioning): No assistance needed to correctly position infant at breast.  LATCH Score: 9   Lactation Tools Discussed/Used    Interventions Interventions: Breast feeding basics reviewed;Skin to skin;Breast massage;Education  Discharge Pump: Personal  Consult Status Consult Status: Follow-up Date: 02/19/21 Follow-up type: In-patient    Leven Hoel R Judeen Geralds 02/18/2021, 8:50 AM

## 2021-02-18 NOTE — Progress Notes (Addendum)
Post Operative Day 1 Subjective: no complaints, up ad lib, voiding, tolerating PO, and + flatus. Desires circumcision and IUD outpatient. Pain is 3 out of 10, but only when getting up. Has mild congestion and cough that causes pain. Denies shortness of breath.   Objective: Blood pressure 102/64, pulse 68, temperature 98.1 F (36.7 C), temperature source Oral, resp. rate 16, height 5\' 3"  (1.6 m), weight 73 kg, last menstrual period 05/21/2020, SpO2 95 %, unknown if currently breastfeeding.  Physical Exam:  General: alert, cooperative, and no distress Lochia: appropriate Uterine Fundus: firm Incision: healing well, minimal strikethrough DVT Evaluation: No evidence of DVT seen on physical exam. No significant calf/ankle edema.  Recent Labs    02/16/21 2141 02/17/21 0544  HGB 10.9* 9.7*  HCT 32.9* 29.5*    Assessment/Plan: Plan for discharge tomorrow. Progressing well postpartum and meeting milestones. VSS.  Contraception - planning on outpatient IUD  Circumcision - planning on outpatient  Feeding - breast, doing well    LOS: 2 days   02/19/21, Medical Student 02/18/2021, 9:04 AM   GME ATTESTATION:  I saw and evaluated the patient. I agree with the findings and the plan of care as documented in the student's note. I have made changes to documentation as necessary.  Doing well postpartum. Plan for discharge tomorrow.  02/20/2021, MD OB Fellow, Faculty [facility], Center for [facility] 02/18/2021 3:20 PM

## 2021-02-19 MED ORDER — POLYETHYLENE GLYCOL 3350 17 G PO PACK: 17.0000 g | PACK | Freq: Every day | ORAL | 0 refills | Status: DC

## 2021-02-19 MED ORDER — NORETHINDRONE 0.35 MG PO TABS: 1.0000 | ORAL_TABLET | Freq: Every day | ORAL | 11 refills | Status: DC

## 2021-02-19 MED ORDER — SIMETHICONE 80 MG PO CHEW: 80.0000 mg | CHEWABLE_TABLET | Freq: Three times a day (TID) | ORAL | 0 refills | Status: DC

## 2021-02-19 MED ORDER — IBUPROFEN 600 MG PO TABS: 600.0000 mg | ORAL_TABLET | Freq: Four times a day (QID) | ORAL | 0 refills | Status: DC

## 2021-02-19 MED ORDER — OXYCODONE HCL 5 MG PO TABS: 5.0000 mg | ORAL_TABLET | ORAL | 0 refills | Status: DC | PRN

## 2021-02-19 MED ORDER — COCONUT OIL OIL: 1.0000 "application " | TOPICAL_OIL | 0 refills | Status: DC | PRN

## 2021-02-19 NOTE — Lactation Note (Signed)
This note was copied from a baby's chart. Lactation Consultation Note  Patient Name: Nicole Clay Date: 02/19/2021 Reason for consult: Follow-up assessment;Term;Primapara;1st time breastfeeding Age:29 hours   [name] whose infant is now 47 hours old.  This is a term baby at 39+6 weeks.  Mother has been breast feeding and supplemented x 1 with formula.  Parents questioning about baby "spitting up."  Discussed this with them and encouraged frequent burping and holding baby upright after feedings.  Emesis has been normal in color.  Parents familiar with the use of the bulb syringe and used it once for a larger emesis.  Discussed volume supplementation amounts.    Mother had no questions/concerns related to breast feeding.  She has our OP phone number for after discharge and a DEBP for home use.  Parents receptive to teaching and work well together.  They are awaiting the pediatrician order for discharge.   Maternal Data    Feeding Mother's Current Feeding Choice: Breast Milk  LATCH Score                    Lactation Tools Discussed/Used    Interventions Interventions: Education  Discharge Discharge Education: Engorgement and breast care Pump: Personal  Consult Status Consult Status: Complete Date: 02/19/21 Follow-up type: Call as needed    Irene Pap Timothee Gali 02/19/2021, 10:44 AM

## 2021-02-20 ENCOUNTER — Encounter: Payer: BC Managed Care – PPO | Admitting: Obstetrics

## 2021-02-25 ENCOUNTER — Inpatient Hospital Stay (HOSPITAL_COMMUNITY)
Admission: AD | Admit: 2021-02-25 | Payer: BC Managed Care – PPO | Source: Home / Self Care | Admitting: Obstetrics & Gynecology

## 2021-02-25 ENCOUNTER — Encounter: Payer: Self-pay | Admitting: Obstetrics and Gynecology

## 2021-02-25 ENCOUNTER — Ambulatory Visit (INDEPENDENT_AMBULATORY_CARE_PROVIDER_SITE_OTHER): Payer: BC Managed Care – PPO | Admitting: Obstetrics and Gynecology

## 2021-02-25 ENCOUNTER — Ambulatory Visit: Payer: BC Managed Care – PPO | Admitting: Obstetrics and Gynecology

## 2021-02-25 ENCOUNTER — Inpatient Hospital Stay (HOSPITAL_COMMUNITY): Payer: BC Managed Care – PPO

## 2021-02-25 ENCOUNTER — Other Ambulatory Visit: Payer: Self-pay

## 2021-02-25 VITALS — BP 121/80 | HR 80

## 2021-02-25 VITALS — BP 121/80

## 2021-02-25 DIAGNOSIS — Z4889 Encounter for other specified surgical aftercare: Secondary | ICD-10-CM

## 2021-02-25 NOTE — Progress Notes (Signed)
30 yo P1 presenting for incision check. Patient had a primary c-section on 8/21 for fetal malpresentation. She reports feeling well and is receiving help from her husband and mother. She denies any concerns with postpartum depression. Patient is breastfeeding without concerns. She is taking percocet occasionally and her pain is well controlled with ibuprofen. Patient has no complaints  Past Medical History:  Diagnosis Date   Anemia    .psh Family History  Problem Relation Age of Onset   Hypertension Father    Social History   Tobacco Use   Smoking status: Light Smoker   Smokeless tobacco: Never   Tobacco comments:    Hooka, not since confirmed pregnancy  Vaping Use   Vaping Use: Never used  Substance Use Topics   Alcohol use: Never   Drug use: Never   ROS See pertinent in HPI. All other systems reviewed and non contributory Blood pressure 121/80, unknown if currently breastfeeding.  GENERAL: Well-developed, well-nourished female in no acute distress.  ABDOMEN: Soft, nontender, nondistended. No organomegaly. Incision: healing well without erythema, induration or drainage. Steri-strips removed all but 2 central EXTREMITIES: No cyanosis, clubbing, or edema, 2+ distal pulses.  A/P 30 yo POD#8 here for wound check - Incision healing well - Would care instructions provided - Patient to return in 4 weeks for postpartum check - patient has POP Rx and is considering IUD

## 2021-02-27 ENCOUNTER — Telehealth (HOSPITAL_COMMUNITY): Payer: Self-pay | Admitting: *Deleted

## 2021-03-25 ENCOUNTER — Encounter: Payer: Self-pay | Admitting: Family Medicine

## 2021-03-25 ENCOUNTER — Ambulatory Visit (INDEPENDENT_AMBULATORY_CARE_PROVIDER_SITE_OTHER): Payer: BC Managed Care – PPO | Admitting: Family Medicine

## 2021-03-25 ENCOUNTER — Other Ambulatory Visit: Payer: Self-pay

## 2021-03-25 NOTE — Progress Notes (Signed)
Pt is in the office for pp visit after c-section on 02-17-21. Pt is unsure of desired BC method at this time.

## 2021-03-25 NOTE — Progress Notes (Signed)
Post Partum Visit Note  Nicole Clay is a 30 y.o. G80P1001 female who presents for a postpartum visit. She is 4 weeks postpartum following a primary cesarean section for breech presentation.  I have fully reviewed the prenatal and intrapartum course. The delivery was at [redacted]w[redacted]d gestational weeks.  Anesthesia: epidural. Postpartum course has been unremarkable and going well thus far. She has had some abdominal soreness and back pain but this is intermittent and only occurs when certain position changes. She takes Tylenol as needed for this. Baby is doing well. Baby is feeding by breast and bottle with breast milk only. Her bleeding initially stopped postpartum and she recently started to have some spotting again yesterday. Bowel function is normal. Bladder function is normal. Patient is not sexually active since her delivery. Patient is still deciding about whether she wants an IUD for contraception. She thinks she would like to use POPs in the interim until she decides. Postpartum depression screening: negative.   Edinburgh Postnatal Depression Scale - 03/25/21 0829       Edinburgh Postnatal Depression Scale:  In the Past 7 Days   I have been able to laugh and see the funny side of things. 0    I have looked forward with enjoyment to things. 0    I have blamed myself unnecessarily when things went wrong. 0    I have been anxious or worried for no good reason. 2    I have felt scared or panicky for no good reason. 0    Things have been getting on top of me. 0    I have been so unhappy that I have had difficulty sleeping. 0    I have felt sad or miserable. 0    I have been so unhappy that I have been crying. 0    The thought of harming myself has occurred to me. 0    Edinburgh Postnatal Depression Scale Total 2             Health Maintenance Due  Topic Date Due   COVID-19 Vaccine (1) Never done   PAP-Cervical Cytology Screening  Never done   PAP SMEAR-Modifier  Never done   INFLUENZA  VACCINE  01/28/2021    The following portions of the patient's history were reviewed and updated as appropriate: allergies, current medications, past family history, past medical history, past social history, past surgical history, and problem list.  Review of Systems Pertinent items noted in HPI and remainder of comprehensive ROS otherwise negative.  Objective:  BP 119/77   Pulse 66   Wt 144 lb 9.6 oz (65.6 kg)   Breastfeeding Yes   BMI 25.61 kg/m    General:  alert, cooperative, and no distress  Lungs: normal work of breathing on room air   Heart:  normal rate, warm and well perfused   Abdomen: soft, nontender, nondistended    Wound well approximated incision, no surrounding erythema or drainage       Assessment:   1. Postpartum care and examination - Normal postpartum exam  Plan:   Essential components of care per ACOG recommendations:  1.  Mood and well being: Patient with negative depression screening today. Well-supported at home. No concerns today.  - Patient tobacco use? No.   - Hx of drug use? No.    2. Infant care and feeding:  -Patient currently breastmilk feeding? Yes - going well without concerns.   3. Sexuality, contraception and birth spacing - Patient does not want  a pregnancy in the next year. - Reviewed forms of contraception in tiered fashion. Patient desired oral progesterone-only contraceptive today.  She will schedule a follow up at her convenience if she decides she would like to pursue IUD.  - Patient already has prescription for POPs. - POCT urine pregnancy deferred given patient has not been sexually active since delivery. - Discussed birth spacing of 18 months  4. Sleep and fatigue -Encouraged family/partner/community support of 4 hrs of uninterrupted sleep to help with mood and fatigue  5. Physical Recovery  - Discussed patients delivery and complications. - Patient had a C-section for breech presentation. Incision well-healed today.  -  Patient has urinary incontinence? No. - Patient is safe to resume physical and sexual activity  6.  Health Maintenance - Last pap smear 12/13/2019 - NILM. Follow up due 2024. - Breast Cancer screening indicated? No.   7. Chronic Disease/Pregnancy Condition follow up: None  - PCP follow up  Worthy Rancher, MD Center for [facility], [facility]

## 2021-04-26 ENCOUNTER — Other Ambulatory Visit: Payer: Self-pay

## 2021-04-29 ENCOUNTER — Ambulatory Visit (INDEPENDENT_AMBULATORY_CARE_PROVIDER_SITE_OTHER): Payer: BC Managed Care – PPO

## 2021-04-29 ENCOUNTER — Other Ambulatory Visit: Payer: Self-pay

## 2021-04-29 VITALS — Wt 146.0 lb

## 2021-04-29 DIAGNOSIS — Z30430 Encounter for insertion of intrauterine contraceptive device: Secondary | ICD-10-CM | POA: Diagnosis not present

## 2021-04-29 LAB — POCT URINE PREGNANCY: Preg Test, Ur: NEGATIVE

## 2021-04-29 MED ORDER — LEVONORGESTREL 20 MCG/DAY IU IUD
1.0000 | INTRAUTERINE_SYSTEM | Freq: Once | INTRAUTERINE | Status: AC
Start: 2021-04-29 — End: 2021-04-29
  Administered 2021-04-29: 1 via INTRAUTERINE

## 2021-04-29 NOTE — Progress Notes (Signed)
   GYNECOLOGY PROGRESS NOTE  History:  30 y.o. G1P1001 presents to [facility] office today for IUD insertion.  The following portions of the patient's history were reviewed and updated as appropriate: allergies, current medications, past family history, past medical history, past social history, past surgical history and problem list. Last pap smear on 11/2019 was normal.   Health Maintenance Due  Topic Date Due   COVID-19 Vaccine (1) Never done   Pneumococcal Vaccine 65-91 Years old (1 - PCV) Never done   INFLUENZA VACCINE  01/28/2021     Review of Systems:  Pertinent items are noted in HPI.   Objective:  Physical Exam currently breastfeeding. VS reviewed, nursing note reviewed,  Constitutional: well developed, well nourished, no distress HEENT: normocephalic CV: normal rate Pulm/chest wall: normal effort Breast Exam: deferred Abdomen: soft Neuro: alert and oriented x 3 Skin: warm, dry Psych: affect normal Pelvic exam: Cervix pink, visually closed, without lesion, scant white creamy discharge, vaginal walls and external genitalia normal   IUD Procedure Note Patient identified, informed consent performed.  Discussed risks of irregular bleeding, cramping, infection, malpositioning or misplacement of the IUD outside the uterus which may require further procedures. Time out was performed.  Urine pregnancy test negative.  Speculum placed in the vagina. Cervix visualized. Cleaned with Betadine x 2.  Grasped anteriorly with a single tooth tenaculum. Uterus sounded to 6 cm. Mirena IUD placed per manufacturer's recommendations. Strings trimmed to 3 cm. Tenaculum was removed, good hemostasis noted. Patient tolerated procedure well.   Patient was given post-procedure instructions and the Mirena care card with expiration date. Patient was also asked to check IUD strings periodically and follow up in 4-6 weeks for IUD check.  Assessment & Plan:  1. Encounter for insertion of mirena  IUD - UPT negative - Patient desires Mirena IUD for contraception - Follow up in 4 weeks for string check  - POCT urine pregnancy - levonorgestrel (MIRENA) 20 MCG/DAY IUD 1 each    Brand Males, CNM 04/29/21 1:34 PM

## 2021-04-29 NOTE — Progress Notes (Addendum)
GYN presents for IUD Insert.   UPT today is NEGATIVE.  Administrations This Visit     levonorgestrel (MIRENA) 20 MCG/DAY IUD 1 each     Admin Date 04/29/2021 Action Given Dose 1 each Route Intrauterine Administered By Maretta Bees, RMA

## 2021-05-27 ENCOUNTER — Ambulatory Visit: Payer: BC Managed Care – PPO | Admitting: Advanced Practice Midwife

## 2021-05-27 NOTE — Progress Notes (Deleted)
   GYNECOLOGY CLINIC PROGRESS NOTE  History:  30 y.o. G1P1001 here at [facility] *** today for today for IUD string check; *** IUD was placed  ***. No complaints about the IUD, no concerning side effects.  The following portions of the patient's history were reviewed and updated as appropriate: allergies, current medications, past family history, past medical history, past social history, past surgical history and problem list. Last pap smear on *** was normal, *** HRHPV.  Review of Systems:  Pertinent items are noted in HPI.   Objective:  Physical Exam currently breastfeeding. Gen: NAD Abd: Soft, nontender and nondistended Pelvic: Normal appearing external genitalia; normal appearing vaginal mucosa and cervix.  IUD strings visualized, about *** cm in length outside cervix.   Assessment & Plan:  Normal IUD check. Patient to keep IUD in place for five years; can come in for removal if she desires pregnancy within the next five years. Routine preventative health maintenance measures emphasized.  Sharen Counter, CNM 8:33 AM

## 2021-05-29 ENCOUNTER — Other Ambulatory Visit (HOSPITAL_COMMUNITY): Payer: Self-pay

## 2021-05-29 ENCOUNTER — Telehealth: Payer: BC Managed Care – PPO | Admitting: Physician Assistant

## 2021-05-29 DIAGNOSIS — N3000 Acute cystitis without hematuria: Secondary | ICD-10-CM | POA: Diagnosis not present

## 2021-05-29 MED ORDER — CEPHALEXIN 500 MG PO CAPS
500.0000 mg | ORAL_CAPSULE | Freq: Two times a day (BID) | ORAL | 0 refills | Status: AC
  Filled 2021-05-29: qty 14, 7d supply, fill #0

## 2021-05-29 NOTE — Progress Notes (Signed)
Virtual Visit Consent   Nicole Clay, you are scheduled for a virtual visit with a [facility] provider today.     Just as with appointments in the office, your consent must be obtained to participate.  Your consent will be active for this visit and any virtual visit you may have with one of our providers in the next 365 days.     If you have a MyChart account, a copy of this consent can be sent to you electronically.  All virtual visits are billed to your insurance company just like a traditional visit in the office.    As this is a virtual visit, video technology does not allow for your provider to perform a traditional examination.  This may limit your provider's ability to fully assess your condition.  If your provider identifies any concerns that need to be evaluated in person or the need to arrange testing (such as labs, EKG, etc.), we will make arrangements to do so.     Although advances in technology are sophisticated, we cannot ensure that it will always work on either your end or our end.  If the connection with a video visit is poor, the visit may have to be switched to a telephone visit.  With either a video or telephone visit, we are not always able to ensure that we have a secure connection.     I need to obtain your verbal consent now.   Are you willing to proceed with your visit today?    Kaliope Quinonez has provided verbal consent on 05/29/2021 for a virtual visit (video or telephone).   Piedad Climes, [place]   Date: 05/29/2021 12:28 PM   Virtual Visit via Video Note   I, Piedad Climes, connected with  Jenetta Wease  ([id number], Apr 23, 1991) on 05/29/21 at 12:15 PM EST by a video-enabled telemedicine application and verified that I am speaking with the correct person using two identifiers.  Location: Patient: Virtual Visit Location Patient: Home Provider: Virtual Visit Location Provider: Home Office   I discussed the limitations of evaluation and management by  telemedicine and the availability of in person appointments. The patient expressed understanding and agreed to proceed.    History of Present Illness: Chaunta Bejarano is a 30 y.o. who identifies as a female who was assigned female at birth, and is being seen today for possible UTI. Notes urinary urgency/frequency and dysuria starting Sunday night. This has continued since that time. Denies hematuria, hesitancy. Denies fevers, chills, nausea or vomiting. Does note some low back pain. Denies vaginal symptoms. Is not currently pregnant. Is breastfeeding. Has history of UTI but has been years ago.   HPI: HPI  Problems:  Patient Active Problem List   Diagnosis Date Noted   Cesarean delivery delivered 02/17/2021   Indication for care in labor and delivery, antepartum 02/16/2021   Supervision of normal first pregnancy, antepartum 08/01/2020   Insomnia 03/06/2017   Anxiety 09/16/2016   Depression 09/16/2016   Seasonal allergic rhinitis due to pollen 01/17/2016   Migraine without aura and without status migrainosus, not intractable 09/12/2015    Allergies:  Allergies  Allergen Reactions   Other Swelling and Rash    Tree nuts and walnuts   Medications:  Current Outpatient Medications:    cephALEXin (KEFLEX) 500 MG capsule, Take 1 capsule (500 mg total) by mouth 2 (two) times daily for 7 days., Disp: 14 capsule, Rfl: 0  Observations/Objective: Patient is well-developed, well-nourished in no acute distress.  Resting  comfortably at home.  Head is normocephalic, atraumatic.  No labored breathing. Speech is clear and coherent with logical content.  Patient is alert and oriented at baseline.   Assessment and Plan: 1. Acute cystitis without hematuria - cephALEXin (KEFLEX) 500 MG capsule; Take 1 capsule (500 mg total) by mouth 2 (two) times daily for 7 days.  Dispense: 14 capsule; Refill: 0 Milder symptoms. No alarm signs/symptoms present. She is breat-feeding mother but also feeding about 50% of  time with formula. Will treat empirically with Keflex. Supportive measures and OTC medications reviewed.   Follow Up Instructions: I discussed the assessment and treatment plan with the patient. The patient was provided an opportunity to ask questions and all were answered. The patient agreed with the plan and demonstrated an understanding of the instructions.  A copy of instructions were sent to the patient via MyChart unless otherwise noted below.    The patient was advised to call back or seek an in-person evaluation if the symptoms worsen or if the condition fails to improve as anticipated.  Time:  I spent 10 minutes with the patient via telehealth technology discussing the above problems/concerns.    Leeanne Rio, PA-C

## 2021-05-29 NOTE — Patient Instructions (Signed)
Nicole Clay, thank you for joining Piedad Climes, PA-C for today's virtual visit.  While this provider is not your primary care provider (PCP), if your PCP is located in our provider database this encounter information will be shared with them immediately following your visit.  Consent: (Patient) Nicole Clay provided verbal consent for this virtual visit at the beginning of the encounter.  Current Medications: No current outpatient medications on file.   Medications ordered in this encounter:  No orders of the defined types were placed in this encounter.    *If you need refills on other medications prior to your next appointment, please contact your pharmacy*  Follow-Up: Call back or seek an in-person evaluation if the symptoms worsen or if the condition fails to improve as anticipated.  Other Instructions Your symptoms are consistent with a bladder infection, also called acute cystitis. Please take your antibiotic (Keflex) as directed until all pills are gone.  Stay very well hydrated.  Consider a daily probiotic (Align, Culturelle, or Activia) to help prevent stomach upset caused by the antibiotic.  Taking a probiotic daily may also help prevent recurrent UTIs.    Remember to supplement formula feedings when possible. Even though the antibiotic is safe to take while breastfeeding it still can cause stomach upset, etc, so please monitor baby and report any changes to pediatrician.   Urinary Tract Infection A urinary tract infection (UTI) can occur any place along the urinary tract. The tract includes the kidneys, ureters, bladder, and urethra. A type of germ called bacteria often causes a UTI. UTIs are often helped with antibiotic medicine.  HOME CARE  If given, take antibiotics as told by your doctor. Finish them even if you start to feel better. Drink enough fluids to keep your pee (urine) clear or pale yellow. Avoid tea, drinks with caffeine, and bubbly (carbonated)  drinks. Pee often. Avoid holding your pee in for a long time. Pee before and after having sex (intercourse). Wipe from front to back after you poop (bowel movement) if you are a woman. Use each tissue only once. GET HELP RIGHT AWAY IF:  You have back pain. You have lower belly (abdominal) pain. You have chills. You feel sick to your stomach (nauseous). You throw up (vomit). Your burning or discomfort with peeing does not go away. You have a fever. Your symptoms are not better in 3 days. MAKE SURE YOU:  Understand these instructions. Will watch your condition. Will get help right away if you are not doing well or get worse. Document Released: 12/03/2007 Document Revised: 03/10/2012 Document Reviewed: 01/15/2012 [facility] Patient Information 2015 [place], [place]. This information is not intended to replace advice given to you by your health care provider. Make sure you discuss any questions you have with your health care provider.    If you have been instructed to have an in-person evaluation today at a local Urgent Care facility, please use the link below. It will take you to a list of all of our available [facility], including address, phone number and hours of operation. Please do not delay care.  [facility]  If you or a family member do not have a primary care provider, use the link below to schedule a visit and establish care. When you choose a [facility] primary care physician or advanced practice provider, you gain a long-term partner in health. Find a Primary Care Provider  Learn more about [facility]'s in-office and virtual care options: [facility] -  Get Care Now

## 2021-06-10 ENCOUNTER — Ambulatory Visit (HOSPITAL_COMMUNITY)
Admission: EM | Admit: 2021-06-10 | Discharge: 2021-06-10 | Disposition: A | Discharge status: Home / Self Care | Payer: BC Managed Care – PPO | Attending: Family Medicine | Admitting: Family Medicine

## 2021-06-10 ENCOUNTER — Other Ambulatory Visit: Payer: Self-pay

## 2021-06-10 ENCOUNTER — Other Ambulatory Visit (HOSPITAL_COMMUNITY): Payer: Self-pay

## 2021-06-10 ENCOUNTER — Encounter (HOSPITAL_COMMUNITY): Payer: Self-pay

## 2021-06-10 DIAGNOSIS — R300 Dysuria: Secondary | ICD-10-CM | POA: Insufficient documentation

## 2021-06-10 DIAGNOSIS — N898 Other specified noninflammatory disorders of vagina: Secondary | ICD-10-CM | POA: Diagnosis not present

## 2021-06-10 LAB — POCT URINALYSIS DIPSTICK, ED / UC
Bilirubin Urine: NEGATIVE
Glucose, UA: NEGATIVE mg/dL
Ketones, ur: NEGATIVE mg/dL
Nitrite: NEGATIVE
Protein, ur: NEGATIVE mg/dL
Specific Gravity, Urine: 1.02 (ref 1.005–1.030)
Urobilinogen, UA: 0.2 mg/dL (ref 0.0–1.0)
pH: 6 (ref 5.0–8.0)

## 2021-06-10 MED ORDER — FLUCONAZOLE 150 MG PO TABS
ORAL_TABLET | ORAL | 0 refills | Status: AC
  Filled 2021-06-10: qty 2, 3d supply, fill #0

## 2021-06-10 MED ORDER — NITROFURANTOIN MONOHYD MACRO 100 MG PO CAPS
100.0000 mg | ORAL_CAPSULE | Freq: Two times a day (BID) | ORAL | 0 refills | Status: AC
  Filled 2021-06-10: qty 14, 7d supply, fill #0

## 2021-06-10 NOTE — ED Triage Notes (Signed)
Pt presents for urinary urgency and frequency. Pt states the sxs have not improved. States she started having vaginal irritation and pressure when urinating. Denies burning, abdominal and back pain. Denies vaginal discharge and states she recently had a cycle and an IUD placed.

## 2021-06-10 NOTE — ED Provider Notes (Signed)
[facility]    CSN: [id number] Arrival date & time: 06/10/21  1110      History   Chief Complaint Chief Complaint  Patient presents with   Urinary Tract Infection    HPI Nicole Clay is a 30 y.o. female.  Started with painful urination over a week ago;  did a virtual visit, took abx x 7 days;  symptoms improved while taking them.  Finished last week Thursday, and her symptoms returned, worsening.  Painful urination, pressure sensation toward the end of the stream.  Some urinary frequency;   Started with vaginal "irritation", slight vaginal d/c after the abx.  Slight itching that comes/goes;   No abd pain;  no back pain;  no fevers/chills;   No prior h/o utis IUD placed 5 weeks ago;  Baby 3 months ago;  Sexually active, her husband;  no risk for stds;   Past Medical History:  Diagnosis Date   Anemia     Patient Active Problem List   Diagnosis Date Noted   Cesarean delivery delivered 02/17/2021   Indication for care in labor and delivery, antepartum 02/16/2021   Supervision of normal first pregnancy, antepartum 08/01/2020   Insomnia 03/06/2017   Anxiety 09/16/2016   Depression 09/16/2016   Seasonal allergic rhinitis due to pollen 01/17/2016   Migraine without aura and without status migrainosus, not intractable 09/12/2015    Past Surgical History:  Procedure Laterality Date   CESAREAN SECTION  02/17/2021   Procedure: CESAREAN SECTION;  Surgeon: Warden Fillers, MD;  Location: [facility] LD ORS;  Service: Obstetrics;;   WISDOM TOOTH EXTRACTION  02/2020    OB History     Gravida  1   Para  1   Term  1   Preterm      AB      Living  1      SAB      IAB      Ectopic      Multiple  0   Live Births  1            Home Medications    Prior to Admission medications   Not on File    Family History Family History  Problem Relation Age of Onset   Hypertension Father     Social History Social History   Tobacco Use   Smoking status:  Light Smoker   Smokeless tobacco: Never   Tobacco comments:    Hooka, not since confirmed pregnancy  Vaping Use   Vaping Use: Never used  Substance Use Topics   Alcohol use: Never   Drug use: Never     Allergies   Other   Review of Systems Review of Systems  Constitutional: Negative.   HENT: Negative.    Respiratory: Negative.    Cardiovascular: Negative.   Gastrointestinal: Negative.   Genitourinary:  Positive for dysuria, frequency and urgency. Negative for flank pain and vaginal pain.    Physical Exam Triage Vital Signs ED Triage Vitals  Enc Vitals Group     BP 06/10/21 1337 123/80     Pulse Rate 06/10/21 1337 86     Resp 06/10/21 1337 19     Temp 06/10/21 1337 97.8 F (36.6 C)     Temp src --      SpO2 06/10/21 1337 96 %     Weight --      Height --      Head Circumference --      Peak Flow --  Pain Score 06/10/21 1335 0     Pain Loc --      Pain Edu? --      Excl. in GC? --    No data found.  Updated Vital Signs BP 123/80 (BP Location: Right Arm)   Pulse 86   Temp 97.8 F (36.6 C)   Resp 19   LMP 06/07/2021 (Exact Date)   SpO2 96%    Physical Exam Constitutional:      Appearance: Normal appearance.  Cardiovascular:     Rate and Rhythm: Normal rate and regular rhythm.  Pulmonary:     Effort: Pulmonary effort is normal.     Breath sounds: Normal breath sounds.  Abdominal:     General: Abdomen is flat. Bowel sounds are normal.     Palpations: Abdomen is soft.     Tenderness: There is abdominal tenderness.     Comments: Slight suprapubic tenderness noted  Neurological:     Mental Status: She is alert.     UC Treatments / Results  Labs (all labs ordered are listed, but only abnormal results are displayed) Labs Reviewed  POCT URINALYSIS DIPSTICK, ED / UC - Abnormal; Notable for the following components:      Result Value   Hgb urine dipstick SMALL (*)    Leukocytes,Ua SMALL (*)    All other components within normal limits  URINE  CULTURE  CERVICOVAGINAL ANCILLARY ONLY    EKG   Radiology No results found.  Procedures Procedures (including critical care time)  Medications Ordered in UC Medications - No data to display  Initial Impression / Assessment and Plan / UC Course  I have reviewed the triage vital signs and the nursing notes.  Pertinent labs & imaging results that were available during my care of the patient were reviewed by me and considered in my medical decision making (see chart for details).  Urine has slight blood, leuks;  Will treat for UTI today and will culture urine;  advised she will get a call about results if needed.  Will also treat for potential yeast infection with diflucan;   Advised to f/u with her pcp if no improvement, or recurrent symptoms.    Final Clinical Impressions(s) / UC Diagnoses   Final diagnoses:  Dysuria  Vaginal discharge     Discharge Instructions      Your urine shows some signs of infection.  I have sent out a medication to your pharmacy to take twice/day x 7 days;  You will be notified of the culture results in 24-48 hrs.  I have treated you for yeast as well.  Please take diflucan x 1 dose now, and repeat in 3 days if needed.  Your vaginal swab should be resulted in 24-48 hrs as well.      ED Prescriptions     Medication Sig Dispense Auth. Provider   fluconazole (DIFLUCAN) 150 MG tablet 1 tab po x 1 dose, and repeat in 3 days 2 tablet Breyonna Nault, MD   nitrofurantoin, macrocrystal-monohydrate, (MACROBID) 100 MG capsule Take 1 capsule (100 mg total) by mouth 2 (two) times daily for 7 days. 14 capsule Jannifer Franklin, MD      PDMP not reviewed this encounter.   Jannifer Franklin, MD 06/10/21 [phone]

## 2021-06-10 NOTE — Discharge Instructions (Addendum)
Your urine shows some signs of infection.  I have sent out a medication to your pharmacy to take twice/day x 7 days;  You will be notified of the culture results in 24-48 hrs.  I have treated you for yeast as well.  Please take diflucan x 1 dose now, and repeat in 3 days if needed.  Your vaginal swab should be resulted in 24-48 hrs as well.

## 2021-06-11 LAB — CERVICOVAGINAL ANCILLARY ONLY
Bacterial Vaginitis (gardnerella): NEGATIVE
Chlamydia: NEGATIVE
Comment: NEGATIVE
Comment: NEGATIVE
Comment: NEGATIVE
Comment: NORMAL
Neisseria Gonorrhea: NEGATIVE
Trichomonas: NEGATIVE

## 2021-06-12 LAB — URINE CULTURE: Culture: 30000 — AB

## 2021-09-10 DIAGNOSIS — Z0000 Encounter for general adult medical examination without abnormal findings: Secondary | ICD-10-CM | POA: Diagnosis not present

## 2021-09-10 DIAGNOSIS — Z1329 Encounter for screening for other suspected endocrine disorder: Secondary | ICD-10-CM | POA: Diagnosis not present

## 2021-09-10 DIAGNOSIS — J309 Allergic rhinitis, unspecified: Secondary | ICD-10-CM | POA: Diagnosis not present

## 2021-09-10 DIAGNOSIS — Z0001 Encounter for general adult medical examination with abnormal findings: Secondary | ICD-10-CM | POA: Diagnosis not present

## 2021-09-10 DIAGNOSIS — G4700 Insomnia, unspecified: Secondary | ICD-10-CM | POA: Diagnosis not present

## 2021-09-10 DIAGNOSIS — M25542 Pain in joints of left hand: Secondary | ICD-10-CM | POA: Diagnosis not present

## 2021-09-10 DIAGNOSIS — M25541 Pain in joints of right hand: Secondary | ICD-10-CM | POA: Diagnosis not present

## 2021-09-10 DIAGNOSIS — H1013 Acute atopic conjunctivitis, bilateral: Secondary | ICD-10-CM | POA: Diagnosis not present

## 2021-09-10 DIAGNOSIS — F3342 Major depressive disorder, recurrent, in full remission: Secondary | ICD-10-CM | POA: Diagnosis not present

## 2021-09-10 DIAGNOSIS — J3089 Other allergic rhinitis: Secondary | ICD-10-CM | POA: Diagnosis not present

## 2021-09-10 DIAGNOSIS — Z13220 Encounter for screening for lipoid disorders: Secondary | ICD-10-CM | POA: Diagnosis not present

## 2021-09-13 DIAGNOSIS — R8290 Unspecified abnormal findings in urine: Secondary | ICD-10-CM | POA: Diagnosis not present

## 2021-10-08 DIAGNOSIS — N898 Other specified noninflammatory disorders of vagina: Secondary | ICD-10-CM | POA: Diagnosis not present

## 2021-10-17 IMAGING — US US MFM OB COMP +14 WKS
1 series · 13 of 28 positions shown · non-contrast
Comparison: none

[Series 1: us mfm ob comp +14 wks · 13 of 112 slices shown]
[im 5/112]
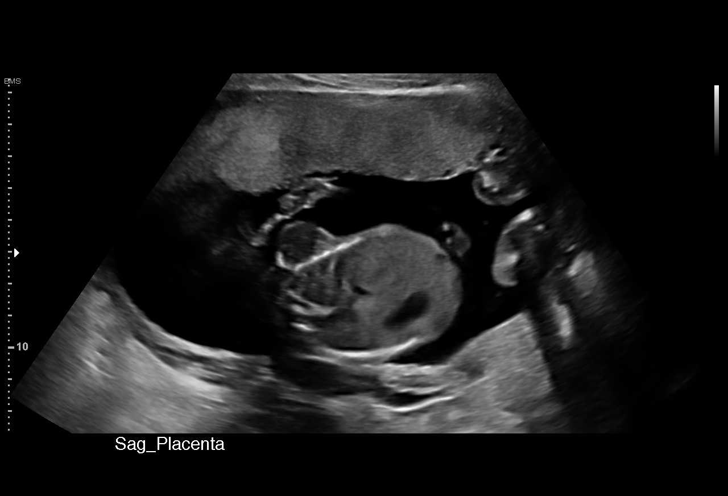
[im 13/112]
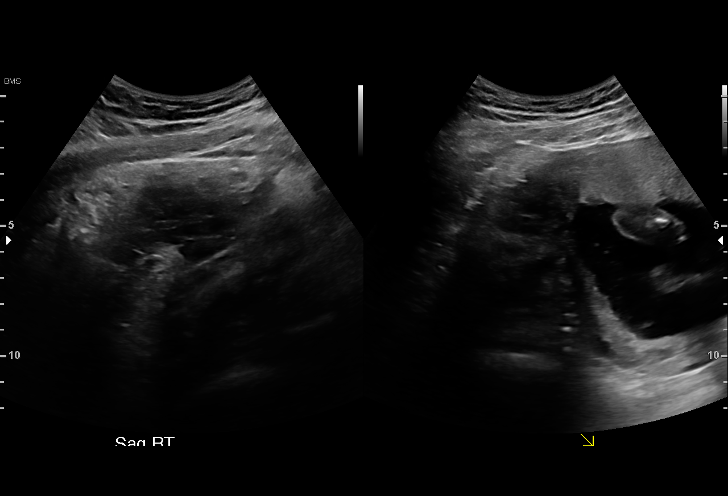
[im 21/112]
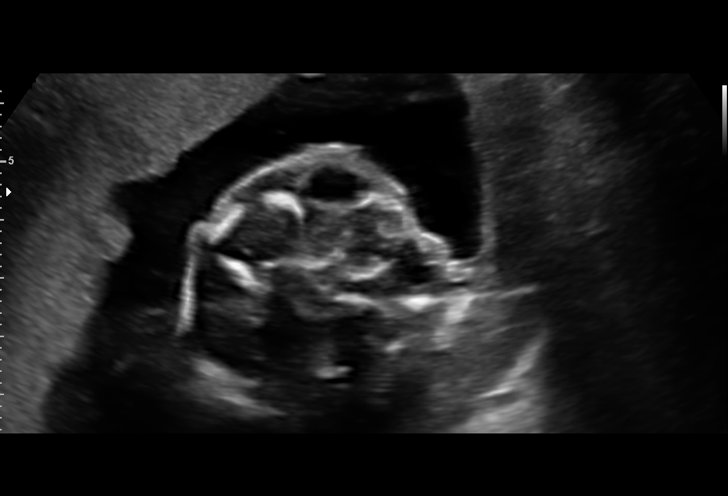
[im 29/112]
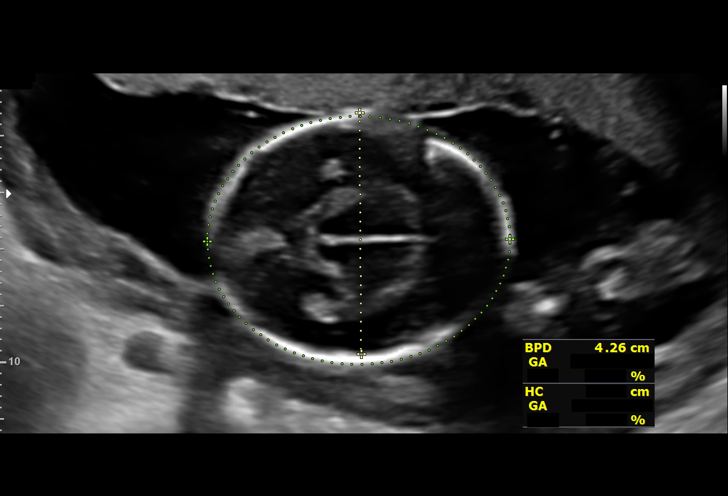
[im 38/112]
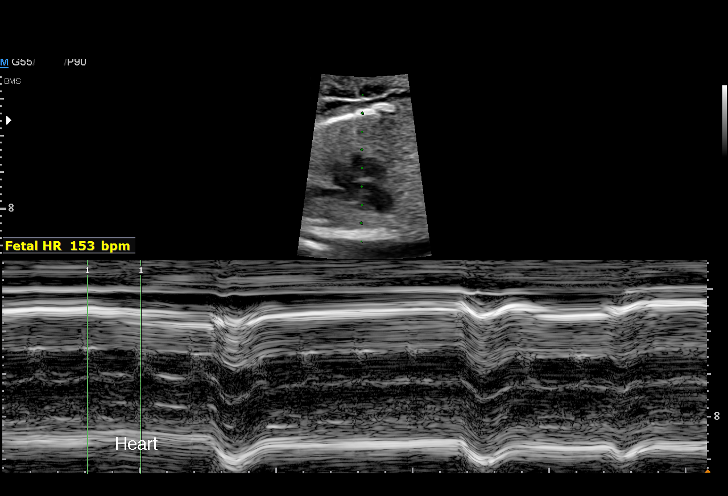
[im 46/112]
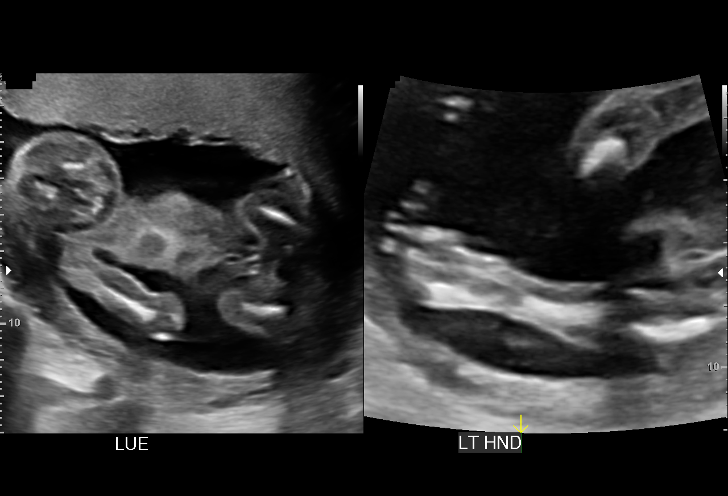
[im 58/112]
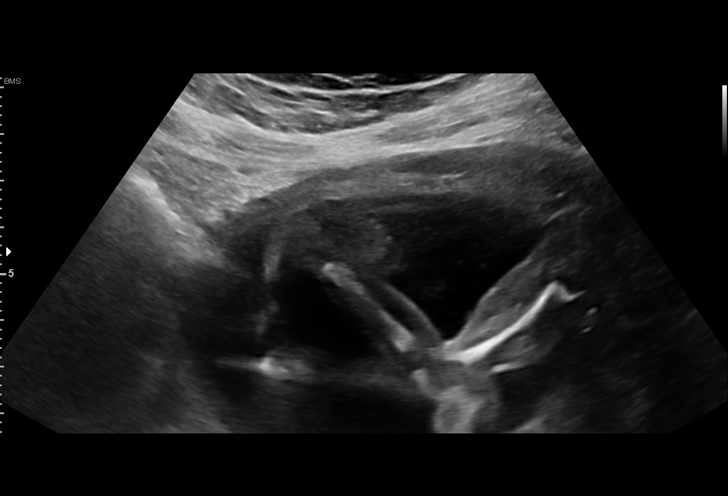
[im 66/112]
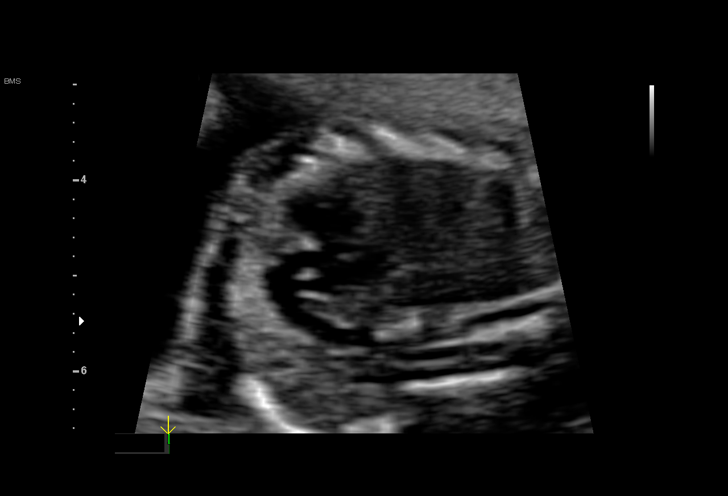
[im 75/112]
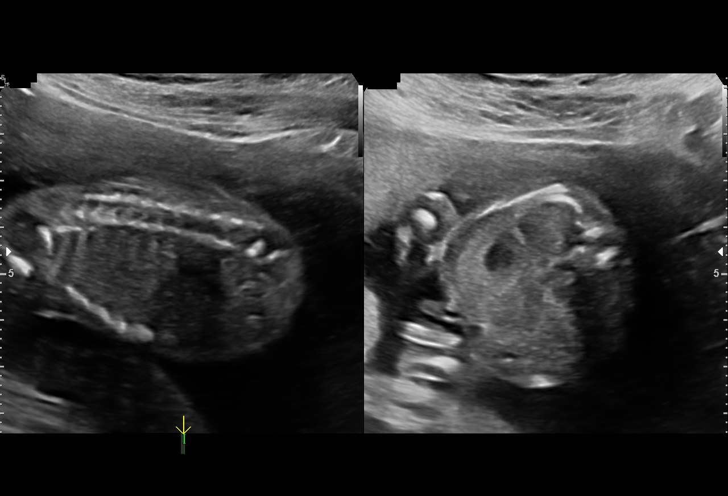
[im 83/112]
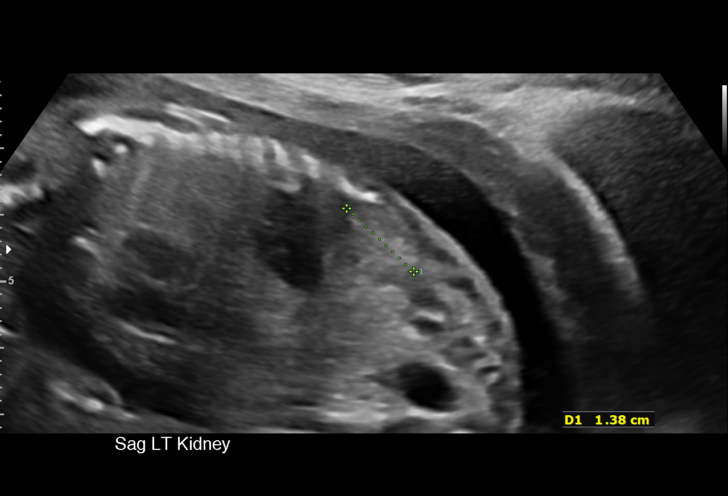
[im 91/112]
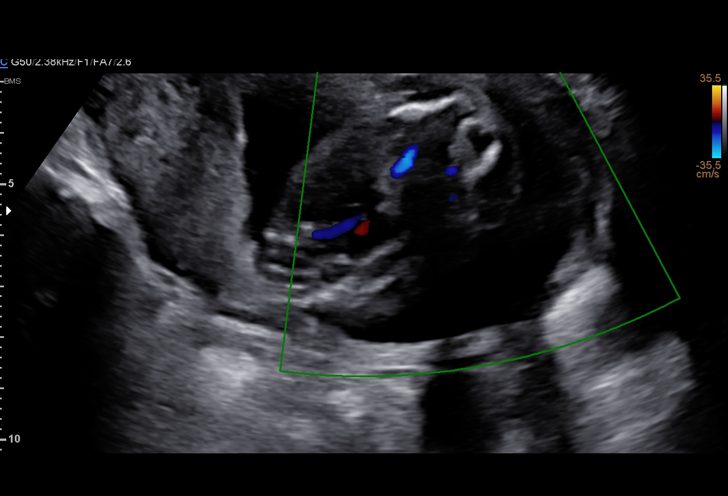
[im 99/112]
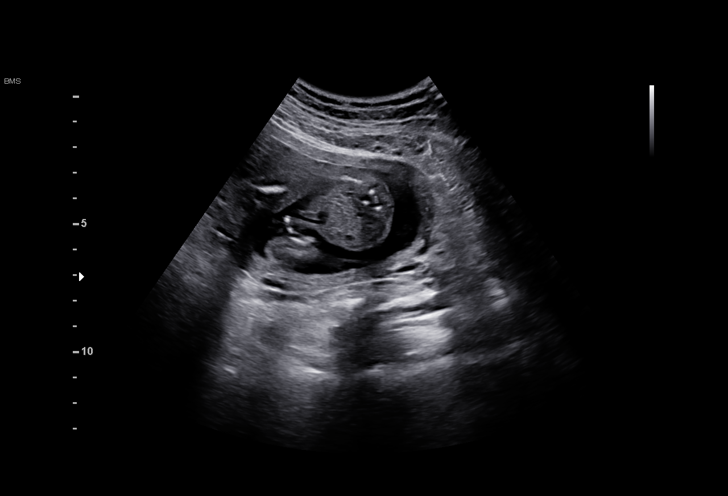
[im 107/112]
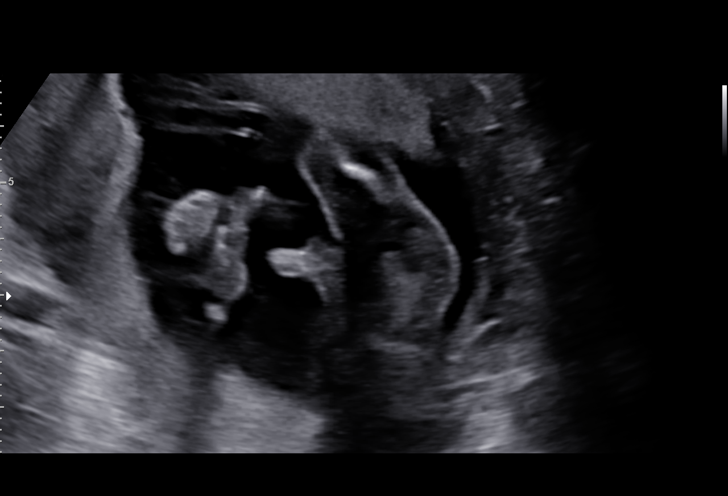

[13 of 28 positions shown; findings below may reference images not displayed]

1  US MFM OB COMP + 14 WK                76805.01    NAFFY MRIDHA

Indications

 Fetal abnormality - other known or suspected
 19 weeks gestation of pregnancy
 Encounter for antenatal screening for
 malformations (LR NIPS, Neg Horizon)
Fetal Evaluation

 Num Of Fetuses:         1
 Fetal Heart Rate(bpm):  153
 Cardiac Activity:       Observed
 Presentation:           Breech
 Placenta:               Anterior
 P. Cord Insertion:      Visualized

 Amniotic Fluid
 AFI FV:      Within normal limits

                             Largest Pocket(cm)


 Comment:    PCI 2.09cm from placental edge
Biometry

 BPD:      42.5  mm     G. Age:  18w 6d         38  %    CI:        78.16   %    70 - 86
                                                         FL/HC:      18.0   %    16.1 -
 HC:      152.1  mm     G. Age:  18w 2d          8  %    HC/AC:      1.11        1.09 -
 AC:      137.4  mm     G. Age:  19w 1d         46  %    FL/BPD:     64.5   %
 FL:       27.4  mm     G. Age:  18w 3d         18  %    FL/AC:      19.9   %    20 - 24
 HUM:      28.3  mm     G. Age:  19w 1d         50  %
 CER:      19.1  mm     G. Age:  18w 5d         21  %
 NFT:       3.3  mm

 LV:        6.9  mm
 CM:        4.6  mm

 Est. FW:     255  gm      0 lb 9 oz     24  %
OB History

 Gravidity:    1
 Living:       0
Gestational Age

 LMP:           18w 1d        Date:  05/21/20                 EDD:   02/25/21
 U/S Today:     18w 5d                                        EDD:   02/21/21
 Best:          19w 1d     Det. By:  U/S C R L  (08/01/20)    EDD:   02/18/21
Anatomy

 Cranium:               Appears normal         LVOT:                   Not well visualized
 Cavum:                 Appears normal         Aortic Arch:            Appears normal
 Ventricles:            Appears normal         Ductal Arch:            Not well visualized
 Choroid Plexus:        Appears normal         Diaphragm:              Appears normal
 Cerebellum:            Appears normal         Stomach:                Appears normal, left
                                                                       sided
 Posterior Fossa:       Appears normal         Abdomen:                Appears normal
 Nuchal Fold:           Appears normal         Abdominal Wall:         Not well visualized
 Face:                  Appears normal         Cord Vessels:           Appears normal (3
                        (orbits and profile)                           vessel cord)
 Lips:                  Appears normal         Kidneys:                Appear normal
 Palate:                Not well visualized    Bladder:                Appears normal
 Thoracic:              Appears normal         Spine:                  Ltd views no
                                                                       intracranial signs of
                                                                       NTD
 Heart:                 Not well visualized    Upper Extremities:      Appears normal
 RVOT:                  Appears normal         Lower Extremities:      Appears normal

 Other:  Lenses visualized. Nasal bone visualized. Fetus appears to be a male.
Cervix Uterus Adnexa

 Cervix
 Length:           3.69  cm.
 Normal appearance by transabdominal scan.

 Uterus
 No abnormality visualized.

 Right Ovary
 Not visualized.

 Left Ovary
 Not visualized.
 Cul De Sac
 No free fluid seen.

 Adnexa
 No adnexal mass visualized.
Comments

 This patient was seen for a detailed fetal anatomy scan.
 She denies any significant past medical history and denies
 any problems in her current pregnancy.
 She had a cell free DNA test earlier in her pregnancy which
 indicated a low risk for trisomy 21, 18, and 13. A male fetus is
 predicted.
 She was informed that the fetal growth and amniotic fluid
 level were appropriate for her gestational age.
 There were no obvious fetal anomalies noted on today's
 ultrasound exam.  However, the views of the fetal anatomy
 were limited today due to the fetal position.
 The patient was informed that anomalies may be missed due
 to technical limitations. If the fetus is in a suboptimal position
 or maternal habitus is increased, visualization of the fetus in
 the maternal uterus may be impaired.
 A follow-up exam was scheduled in 4 weeks to complete the
 views of the fetal anatomy.

## 2021-12-20 DIAGNOSIS — M25541 Pain in joints of right hand: Secondary | ICD-10-CM | POA: Diagnosis not present

## 2021-12-20 DIAGNOSIS — M79641 Pain in right hand: Secondary | ICD-10-CM | POA: Diagnosis not present

## 2021-12-20 DIAGNOSIS — M25542 Pain in joints of left hand: Secondary | ICD-10-CM | POA: Diagnosis not present

## 2021-12-20 DIAGNOSIS — M79642 Pain in left hand: Secondary | ICD-10-CM | POA: Diagnosis not present
